# Patient Record
Sex: Male | Born: 1980 | Race: White | Hispanic: No | Marital: Married | Smoking: Current every day smoker
Health system: Southern US, Community
[De-identification: ages and names within clinical notes are randomized; demographics above are authoritative.]

## PROBLEM LIST (undated history)

## (undated) DIAGNOSIS — G43909 Migraine, unspecified, not intractable, without status migrainosus: Secondary | ICD-10-CM

## (undated) DIAGNOSIS — R42 Dizziness and giddiness: Secondary | ICD-10-CM

## (undated) DIAGNOSIS — F419 Anxiety disorder, unspecified: Secondary | ICD-10-CM

## (undated) HISTORY — PX: MOUTH SURGERY: SHX715

---
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
## 2002-03-29 ENCOUNTER — Emergency Department (HOSPITAL_COMMUNITY): Admission: EM | Admit: 2002-03-29 | Discharge: 2002-03-29 | Payer: Self-pay | Admitting: Emergency Medicine

## 2002-04-22 ENCOUNTER — Emergency Department (HOSPITAL_COMMUNITY): Admission: EM | Admit: 2002-04-22 | Discharge: 2002-04-22 | Payer: Self-pay | Admitting: *Deleted

## 2002-05-23 ENCOUNTER — Emergency Department (HOSPITAL_COMMUNITY): Admission: EM | Admit: 2002-05-23 | Discharge: 2002-05-23 | Payer: Self-pay | Admitting: Emergency Medicine

## 2004-07-23 ENCOUNTER — Emergency Department (HOSPITAL_COMMUNITY): Admission: EM | Admit: 2004-07-23 | Discharge: 2004-07-23 | Payer: Self-pay | Admitting: Emergency Medicine

## 2004-08-01 ENCOUNTER — Emergency Department (HOSPITAL_COMMUNITY): Admission: EM | Admit: 2004-08-01 | Discharge: 2004-08-01 | Payer: Self-pay | Admitting: Emergency Medicine

## 2005-01-09 ENCOUNTER — Emergency Department (HOSPITAL_COMMUNITY): Admission: EM | Admit: 2005-01-09 | Discharge: 2005-01-09 | Payer: Self-pay | Admitting: Emergency Medicine

## 2005-01-14 ENCOUNTER — Emergency Department: Payer: Self-pay | Admitting: Emergency Medicine

## 2005-06-06 ENCOUNTER — Emergency Department: Payer: Self-pay | Admitting: Emergency Medicine

## 2005-11-08 ENCOUNTER — Emergency Department: Payer: Self-pay | Admitting: Emergency Medicine

## 2005-11-12 ENCOUNTER — Emergency Department: Payer: Self-pay | Admitting: Unknown Physician Specialty

## 2006-02-03 ENCOUNTER — Emergency Department: Payer: Self-pay | Admitting: Unknown Physician Specialty

## 2006-04-15 ENCOUNTER — Emergency Department: Payer: Self-pay | Admitting: Emergency Medicine

## 2006-04-20 ENCOUNTER — Emergency Department: Payer: Self-pay | Admitting: Emergency Medicine

## 2008-01-16 ENCOUNTER — Emergency Department: Payer: Self-pay | Admitting: Emergency Medicine

## 2008-05-20 ENCOUNTER — Emergency Department: Payer: Self-pay | Admitting: Emergency Medicine

## 2008-08-19 ENCOUNTER — Emergency Department: Payer: Self-pay | Admitting: Emergency Medicine

## 2008-09-01 ENCOUNTER — Emergency Department (HOSPITAL_COMMUNITY): Admission: EM | Admit: 2008-09-01 | Discharge: 2008-09-01 | Payer: Self-pay | Admitting: Emergency Medicine

## 2009-09-04 ENCOUNTER — Emergency Department (HOSPITAL_COMMUNITY): Admission: EM | Admit: 2009-09-04 | Discharge: 2009-09-04 | Payer: Self-pay | Admitting: Emergency Medicine

## 2009-09-27 ENCOUNTER — Emergency Department (HOSPITAL_COMMUNITY): Admission: EM | Admit: 2009-09-27 | Discharge: 2009-09-27 | Payer: Self-pay | Admitting: Emergency Medicine

## 2010-02-20 ENCOUNTER — Emergency Department: Payer: Self-pay | Admitting: Emergency Medicine

## 2010-06-10 ENCOUNTER — Emergency Department: Payer: Self-pay | Admitting: Emergency Medicine

## 2010-12-23 ENCOUNTER — Emergency Department: Payer: Self-pay | Admitting: Emergency Medicine

## 2011-06-05 ENCOUNTER — Emergency Department (HOSPITAL_COMMUNITY)
Admission: EM | Admit: 2011-06-05 | Discharge: 2011-06-06 | Disposition: A | Discharge status: Home / Self Care | Payer: Self-pay | Attending: Emergency Medicine | Admitting: Emergency Medicine

## 2011-06-05 ENCOUNTER — Emergency Department (HOSPITAL_COMMUNITY): Payer: Self-pay

## 2011-06-05 DIAGNOSIS — S0083XA Contusion of other part of head, initial encounter: Secondary | ICD-10-CM | POA: Insufficient documentation

## 2011-06-05 DIAGNOSIS — S0003XA Contusion of scalp, initial encounter: Secondary | ICD-10-CM | POA: Insufficient documentation

## 2011-06-05 DIAGNOSIS — R404 Transient alteration of awareness: Secondary | ICD-10-CM | POA: Insufficient documentation

## 2011-06-05 DIAGNOSIS — S20219A Contusion of unspecified front wall of thorax, initial encounter: Secondary | ICD-10-CM | POA: Insufficient documentation

## 2011-06-05 DIAGNOSIS — S060X1A Concussion with loss of consciousness of 30 minutes or less, initial encounter: Secondary | ICD-10-CM | POA: Insufficient documentation

## 2011-06-05 MED ORDER — IOHEXOL 300 MG/ML  SOLN
100.0000 mL | Freq: Once | INTRAMUSCULAR | Status: AC | PRN
  Administered 2011-06-05: 100 mL via INTRAVENOUS

## 2011-11-22 ENCOUNTER — Emergency Department: Payer: Self-pay | Admitting: Internal Medicine

## 2011-11-22 LAB — COMPREHENSIVE METABOLIC PANEL
Alkaline Phosphatase: 58 U/L (ref 50–136)
BUN: 6 mg/dL — ABNORMAL LOW (ref 7–18)
Bilirubin,Total: 0.9 mg/dL (ref 0.2–1.0)
Calcium, Total: 9.2 mg/dL (ref 8.5–10.1)
Chloride: 102 mmol/L (ref 98–107)
Co2: 24 mmol/L (ref 21–32)
Creatinine: 0.91 mg/dL (ref 0.60–1.30)
EGFR (Non-African Amer.): >60
Osmolality: 271 (ref 275–301)
SGPT (ALT): 30 U/L
Sodium: 137 mmol/L (ref 136–145)
Total Protein: 7.3 g/dL (ref 6.4–8.2)

## 2011-11-22 LAB — CBC
HCT: 41.6 % (ref 40.0–52.0)
MCH: 32.1 pg (ref 26.0–34.0)
MCHC: 33.7 g/dL (ref 32.0–36.0)
Platelet: 179 10*3/uL (ref 150–440)
RDW: 12.8 % (ref 11.5–14.5)
WBC: 16.4 10*3/uL — ABNORMAL HIGH (ref 3.8–10.6)

## 2011-11-24 LAB — BETA STREP CULTURE(ARMC)

## 2011-11-27 LAB — CULTURE, BLOOD (SINGLE)

## 2012-06-07 ENCOUNTER — Encounter (HOSPITAL_COMMUNITY): Payer: Self-pay | Admitting: Emergency Medicine

## 2012-06-07 ENCOUNTER — Emergency Department (HOSPITAL_COMMUNITY): Payer: Self-pay

## 2012-06-07 ENCOUNTER — Emergency Department (HOSPITAL_COMMUNITY)
Admission: EM | Admit: 2012-06-07 | Discharge: 2012-06-07 | Disposition: A | Discharge status: Home / Self Care | Payer: Self-pay | Attending: Emergency Medicine | Admitting: Emergency Medicine

## 2012-06-07 DIAGNOSIS — N50819 Testicular pain, unspecified: Secondary | ICD-10-CM

## 2012-06-07 DIAGNOSIS — N509 Disorder of male genital organs, unspecified: Secondary | ICD-10-CM | POA: Insufficient documentation

## 2012-06-07 DIAGNOSIS — F411 Generalized anxiety disorder: Secondary | ICD-10-CM | POA: Insufficient documentation

## 2012-06-07 DIAGNOSIS — F17200 Nicotine dependence, unspecified, uncomplicated: Secondary | ICD-10-CM | POA: Insufficient documentation

## 2012-06-07 HISTORY — DX: Migraine, unspecified, not intractable, without status migrainosus: G43.909

## 2012-06-07 HISTORY — DX: Anxiety disorder, unspecified: F41.9

## 2012-06-07 LAB — URINALYSIS, ROUTINE W REFLEX MICROSCOPIC
Bilirubin Urine: NEGATIVE
Glucose, UA: NEGATIVE mg/dL
Ketones, ur: NEGATIVE mg/dL
Leukocytes, UA: NEGATIVE
Urobilinogen, UA: 0.2 mg/dL (ref 0.0–1.0)

## 2012-06-07 NOTE — ED Provider Notes (Addendum)
History     CSN: [id number]  Arrival date & time 06/07/12  1409   First MD Initiated Contact with Patient 06/07/12 1505      Chief Complaint  Patient presents with  . Testicle Pain    (Consider location/radiation/quality/duration/timing/severity/associated sxs/prior treatment) Patient is a 31 y.o. male presenting with testicular pain. The history is provided by the patient.  Testicle Pain   patient here complaining of left testicular pain and swelling x1 week. Denies any dysuria or hematuria. No penile drainage or discharge. Pain started at rest and is worse with any kind of scrotal movement. No prior history of same. No history of surgery to that area.  Past Medical History  Diagnosis Date  . Anxiety   . Migraines     No past surgical history on file.  No family history on file.  History  Substance Use Topics  . Smoking status: Current Everyday Smoker  . Smokeless tobacco: Not on file  . Alcohol Use: Yes      Review of Systems  Genitourinary: Positive for testicular pain.  All other systems reviewed and are negative.    Allergies  Penicillins and Sulfa antibiotics  Home Medications   Current Outpatient Rx  Name Route Sig Dispense Refill  . ACETAMINOPHEN 500 MG PO TABS Oral Take 1,000 mg by mouth every 6 (six) hours as needed. For pain.    Marland Kitchen HYDROCODONE-ACETAMINOPHEN 5-500 MG PO TABS Oral Take 1 tablet by mouth once.      BP 105/66  Pulse 81  Temp 98.4 F (36.9 C) (Oral)  Resp 16  Ht 6' (1.829 m)  Wt 140 lb (63.504 kg)  BMI 18.99 kg/m2  SpO2 100%  Physical Exam  Nursing note and vitals reviewed. Constitutional: He is oriented to person, place, and time. Vital signs are normal. He appears well-developed and well-nourished.  Non-toxic appearance. No distress.  HENT:  Head: Normocephalic and atraumatic.  Eyes: Conjunctivae, EOM and lids are normal. Pupils are equal, round, and reactive to light.  Neck: Normal range of motion. Neck supple. No  tracheal deviation present. No mass present.  Cardiovascular: Normal rate, regular rhythm and normal heart sounds.  Exam reveals no gallop.   No murmur heard. Pulmonary/Chest: Effort normal and breath sounds normal. No stridor. No respiratory distress. He has no decreased breath sounds. He has no wheezes. He has no rhonchi. He has no rales.  Abdominal: Soft. Normal appearance and bowel sounds are normal. He exhibits no distension. There is no tenderness. There is no rebound and no CVA tenderness.  Genitourinary:    Left testis shows swelling and tenderness. Left testis shows no mass.  Musculoskeletal: Normal range of motion. He exhibits no edema and no tenderness.  Neurological: He is alert and oriented to person, place, and time. He has normal strength. No cranial nerve deficit or sensory deficit. GCS eye subscore is 4. GCS verbal subscore is 5. GCS motor subscore is 6.  Skin: Skin is warm and dry. No abrasion and no rash noted.  Psychiatric: He has a normal mood and affect. His speech is normal and behavior is normal.    ED Course  Procedures (including critical care time)   Labs Reviewed  URINALYSIS, ROUTINE W REFLEX MICROSCOPIC  URINE CULTURE   No results found.   No diagnosis found.    MDM  Pt given referral to urology and instructed on the importance of followup to exclude cancer        Toy Baker, MD 06/07/12  1706  Toy Baker, MD 06/09/12 [phone]

## 2012-06-07 NOTE — ED Notes (Signed)
Pt returned from ultrasound. No needs at this time.

## 2012-06-07 NOTE — ED Notes (Signed)
Patient transported to Ultrasound 

## 2012-06-07 NOTE — ED Notes (Signed)
Per patient states left testicular pain/swelling for 1 week

## 2012-06-07 NOTE — Progress Notes (Signed)
Late entry for 1705 CM spoke with pt who confirms self pay [facility] resident with no pcp. Discussed the importance of a pcp for f/u. Reviewed [facility] number to assist with finding self pay provider close to pt's residence. Reviewed resources for [facility], general medial clinics, medications-needymeds.com, housing, DSS, health Department and other resources in [employer] including crisis programs Pt voiced understanding and appreciation of resources provided Pt interested in [facility] but aware they are not receiving further patients.  States his mother is active with [facility]

## 2012-06-08 LAB — URINE CULTURE: Culture: NO GROWTH

## 2012-09-29 ENCOUNTER — Emergency Department (HOSPITAL_COMMUNITY)
Admission: EM | Admit: 2012-09-29 | Discharge: 2012-09-29 | Disposition: A | Discharge status: Home / Self Care | Payer: Self-pay | Attending: Emergency Medicine | Admitting: Emergency Medicine

## 2012-09-29 ENCOUNTER — Encounter (HOSPITAL_COMMUNITY): Payer: Self-pay | Admitting: Emergency Medicine

## 2012-09-29 DIAGNOSIS — K0510 Chronic gingivitis, plaque induced: Secondary | ICD-10-CM | POA: Insufficient documentation

## 2012-09-29 DIAGNOSIS — Z8659 Personal history of other mental and behavioral disorders: Secondary | ICD-10-CM | POA: Insufficient documentation

## 2012-09-29 DIAGNOSIS — G43909 Migraine, unspecified, not intractable, without status migrainosus: Secondary | ICD-10-CM | POA: Insufficient documentation

## 2012-09-29 DIAGNOSIS — F17200 Nicotine dependence, unspecified, uncomplicated: Secondary | ICD-10-CM | POA: Insufficient documentation

## 2012-09-29 DIAGNOSIS — K047 Periapical abscess without sinus: Secondary | ICD-10-CM | POA: Insufficient documentation

## 2012-09-29 DIAGNOSIS — R220 Localized swelling, mass and lump, head: Secondary | ICD-10-CM | POA: Insufficient documentation

## 2012-09-29 DIAGNOSIS — H9209 Otalgia, unspecified ear: Secondary | ICD-10-CM | POA: Insufficient documentation

## 2012-09-29 MED ORDER — CLINDAMYCIN HCL 150 MG PO CAPS: 150.0000 mg | ORAL_CAPSULE | Freq: Four times a day (QID) | ORAL | Status: DC

## 2012-09-29 MED ORDER — HYDROMORPHONE HCL PF 1 MG/ML IJ SOLN
1.0000 mg | Freq: Once | INTRAMUSCULAR | Status: AC
  Administered 2012-09-29: 1 mg via INTRAVENOUS
  Filled 2012-09-29: qty 1

## 2012-09-29 MED ORDER — CLINDAMYCIN PHOSPHATE 900 MG/50ML IV SOLN
900.0000 mg | Freq: Once | INTRAVENOUS | Status: AC
  Administered 2012-09-29: 900 mg via INTRAVENOUS
  Filled 2012-09-29: qty 50

## 2012-09-29 MED ORDER — TRAMADOL HCL 50 MG PO TABS: 50.0000 mg | ORAL_TABLET | Freq: Four times a day (QID) | ORAL | Status: DC | PRN

## 2012-09-29 MED ORDER — KETOROLAC TROMETHAMINE 30 MG/ML IJ SOLN
30.0000 mg | Freq: Once | INTRAMUSCULAR | Status: AC
  Administered 2012-09-29: 30 mg via INTRAVENOUS
  Filled 2012-09-29: qty 1

## 2012-09-29 MED ORDER — ONDANSETRON HCL 4 MG/2ML IJ SOLN
4.0000 mg | Freq: Once | INTRAMUSCULAR | Status: AC
  Administered 2012-09-29: 4 mg via INTRAVENOUS
  Filled 2012-09-29: qty 2

## 2012-09-29 MED ORDER — TRAMADOL HCL 50 MG PO TABS
ORAL_TABLET | ORAL | Status: AC
  Filled 2012-09-29: qty 2

## 2012-09-29 MED ORDER — TRAMADOL HCL 50 MG PO TABS
50.0000 mg | ORAL_TABLET | Freq: Once | ORAL | Status: AC
  Administered 2012-09-29: 50 mg via ORAL

## 2012-09-29 NOTE — ED Provider Notes (Signed)
Medical screening examination/treatment/procedure(s) were performed by non-physician practitioner and as supervising physician I was immediately available for consultation/collaboration.  Sunnie Nielsen, MD 09/29/12 [phone]

## 2012-09-29 NOTE — ED Notes (Signed)
Pt states he has pain in his right lower jaw  Pt states he has a broken tooth  Pt has swelling noted to his face on the right side  Pt states the pain is radiating into his neck and up in his face

## 2012-09-29 NOTE — ED Provider Notes (Signed)
History     CSN: [id number]  Arrival date & time 09/29/12  4098   First MD Initiated Contact with Patient 09/29/12 0505      Chief Complaint  Patient presents with  . Dental Pain    (Consider location/radiation/quality/duration/timing/severity/associated sxs/prior treatment) HPI Comments: Patient with a history of pyorrhea presents tonight stating, that one of his decayed teeth broke 2 days ago, and he, has had increased pain, and facial swelling.  He has been taking over-the-counter Tylenol, and ibuprofen, without any relief.  His mother gave him one of her Vicodin, and he received no relief from that either.  He does not have a dentist.  He does not have any medical provider.  Patient is a 31 y.o. male presenting with tooth pain.  Dental PainThe primary symptoms include mouth pain. Primary symptoms do not include dental injury, headaches, fever or shortness of breath. The symptoms began 3 to 5 days ago. The symptoms are worsening. The symptoms occur constantly.  Additional symptoms include: gum swelling, gum tenderness, purulent gums, facial swelling and ear pain. Additional symptoms do not include: trouble swallowing.    Past Medical History  Diagnosis Date  . Anxiety   . Migraines     History reviewed. No pertinent past surgical history.  Family History  Problem Relation Age of Onset  . Cancer Mother   . CAD Other     History  Substance Use Topics  . Smoking status: Current Every Day Smoker    Types: Cigarettes  . Smokeless tobacco: Not on file  . Alcohol Use: Yes      Review of Systems  Constitutional: Negative for fever.  HENT: Positive for ear pain, facial swelling and dental problem. Negative for trouble swallowing and voice change.   Respiratory: Negative for shortness of breath.   Cardiovascular: Negative for chest pain.  Neurological: Negative for dizziness and headaches.    Allergies  Penicillins and Sulfa antibiotics  Home Medications   Current  Outpatient Rx  Name  Route  Sig  Dispense  Refill  . ACETAMINOPHEN 500 MG PO TABS   Oral   Take 1,000 mg by mouth every 6 (six) hours as needed. For pain.         Marland Kitchen CLINDAMYCIN HCL 150 MG PO CAPS   Oral   Take 1 capsule (150 mg total) by mouth every 6 (six) hours.   28 capsule   0   . TRAMADOL HCL 50 MG PO TABS   Oral   Take 1 tablet (50 mg total) by mouth every 6 (six) hours as needed for pain.   15 tablet   0     BP 132/96  Pulse 88  Temp 98.3 F (36.8 C) (Oral)  Resp 22  SpO2 100%  Physical Exam  Constitutional: He appears well-developed and well-nourished.  HENT:  Head: Normocephalic.    Mouth/Throat: Uvula is midline and mucous membranes are normal.         Severe dental decay, and chronic gum swelling.  Right lower first molar broken  Eyes: Pupils are equal, round, and reactive to light.  Neck: Normal range of motion.  Cardiovascular: Normal rate.   Pulmonary/Chest: Effort normal.  Musculoskeletal: Normal range of motion.  Lymphadenopathy:    He has cervical adenopathy.  Neurological: He is alert.  Skin: Skin is warm. No erythema.    ED Course  Procedures (including critical care time)  Labs Reviewed - No data to display No results found.   1. Dental  abscess   2. Chronic simple marginal gingivitis       MDM  Dental abscess         Arman Filter, NP 09/29/12 [phone]

## 2013-06-07 ENCOUNTER — Emergency Department: Payer: Self-pay | Admitting: Emergency Medicine

## 2013-06-08 ENCOUNTER — Emergency Department: Payer: Self-pay | Admitting: Emergency Medicine

## 2013-07-30 ENCOUNTER — Emergency Department: Payer: Self-pay | Admitting: Emergency Medicine

## 2013-08-13 ENCOUNTER — Emergency Department (HOSPITAL_COMMUNITY)
Admission: EM | Admit: 2013-08-13 | Discharge: 2013-08-13 | Disposition: A | Discharge status: Home / Self Care | Payer: Self-pay | Attending: Emergency Medicine | Admitting: Emergency Medicine

## 2013-08-13 ENCOUNTER — Emergency Department (HOSPITAL_COMMUNITY): Payer: Self-pay

## 2013-08-13 ENCOUNTER — Encounter (HOSPITAL_COMMUNITY): Payer: Self-pay | Admitting: *Deleted

## 2013-08-13 ENCOUNTER — Encounter (HOSPITAL_COMMUNITY): Payer: Self-pay | Admitting: Emergency Medicine

## 2013-08-13 DIAGNOSIS — Z8679 Personal history of other diseases of the circulatory system: Secondary | ICD-10-CM | POA: Insufficient documentation

## 2013-08-13 DIAGNOSIS — R42 Dizziness and giddiness: Secondary | ICD-10-CM | POA: Insufficient documentation

## 2013-08-13 DIAGNOSIS — S0993XA Unspecified injury of face, initial encounter: Secondary | ICD-10-CM | POA: Insufficient documentation

## 2013-08-13 DIAGNOSIS — H538 Other visual disturbances: Secondary | ICD-10-CM | POA: Insufficient documentation

## 2013-08-13 DIAGNOSIS — IMO0002 Reserved for concepts with insufficient information to code with codable children: Secondary | ICD-10-CM | POA: Insufficient documentation

## 2013-08-13 DIAGNOSIS — Z8659 Personal history of other mental and behavioral disorders: Secondary | ICD-10-CM | POA: Insufficient documentation

## 2013-08-13 DIAGNOSIS — S298XXA Other specified injuries of thorax, initial encounter: Secondary | ICD-10-CM | POA: Insufficient documentation

## 2013-08-13 DIAGNOSIS — S060X9D Concussion with loss of consciousness of unspecified duration, subsequent encounter: Secondary | ICD-10-CM

## 2013-08-13 DIAGNOSIS — I609 Nontraumatic subarachnoid hemorrhage, unspecified: Secondary | ICD-10-CM

## 2013-08-13 DIAGNOSIS — F17200 Nicotine dependence, unspecified, uncomplicated: Secondary | ICD-10-CM | POA: Insufficient documentation

## 2013-08-13 DIAGNOSIS — R112 Nausea with vomiting, unspecified: Secondary | ICD-10-CM | POA: Insufficient documentation

## 2013-08-13 DIAGNOSIS — R911 Solitary pulmonary nodule: Secondary | ICD-10-CM | POA: Insufficient documentation

## 2013-08-13 DIAGNOSIS — Z880 Allergy status to penicillin: Secondary | ICD-10-CM | POA: Insufficient documentation

## 2013-08-13 DIAGNOSIS — S066X9A Traumatic subarachnoid hemorrhage with loss of consciousness of unspecified duration, initial encounter: Secondary | ICD-10-CM | POA: Insufficient documentation

## 2013-08-13 DIAGNOSIS — R5381 Other malaise: Secondary | ICD-10-CM | POA: Insufficient documentation

## 2013-08-13 DIAGNOSIS — S060X9A Concussion with loss of consciousness of unspecified duration, initial encounter: Secondary | ICD-10-CM | POA: Insufficient documentation

## 2013-08-13 DIAGNOSIS — R209 Unspecified disturbances of skin sensation: Secondary | ICD-10-CM | POA: Insufficient documentation

## 2013-08-13 LAB — CBC WITH DIFFERENTIAL/PLATELET
Basophils Absolute: 0.1 10*3/uL (ref 0.0–0.1)
Eosinophils Relative: 2 % (ref 0–5)
Lymphocytes Relative: 26 % (ref 12–46)
Lymphs Abs: 3.9 10*3/uL (ref 0.7–4.0)
MCV: 91.1 fL (ref 78.0–100.0)
Monocytes Absolute: 1.2 10*3/uL — ABNORMAL HIGH (ref 0.1–1.0)
Neutrophils Relative %: 64 % (ref 43–77)
Platelets: 248 10*3/uL (ref 150–400)
RBC: 4.59 MIL/uL (ref 4.22–5.81)
RDW: 12.9 % (ref 11.5–15.5)
WBC: 14.8 10*3/uL — ABNORMAL HIGH (ref 4.0–10.5)

## 2013-08-13 LAB — BASIC METABOLIC PANEL
CO2: 23 mEq/L (ref 19–32)
Calcium: 8.7 mg/dL (ref 8.4–10.5)
GFR calc non Af Amer: >90 mL/min (ref >90)
Glucose, Bld: 99 mg/dL (ref 70–99)
Potassium: 3.9 mEq/L (ref 3.5–5.1)
Sodium: 143 mEq/L (ref 135–145)

## 2013-08-13 LAB — PROTIME-INR
INR: 0.96 (ref 0.00–1.49)
Prothrombin Time: 12.6 seconds (ref 11.6–15.2)

## 2013-08-13 LAB — ETHANOL: Alcohol, Ethyl (B): 200 mg/dL — ABNORMAL HIGH (ref 0–11)

## 2013-08-13 MED ORDER — HYDROMORPHONE HCL PF 1 MG/ML IJ SOLN
1.0000 mg | Freq: Once | INTRAMUSCULAR | Status: AC
  Administered 2013-08-13: 1 mg via INTRAVENOUS
  Filled 2013-08-13: qty 1

## 2013-08-13 MED ORDER — HYDROCODONE-ACETAMINOPHEN 5-325 MG PO TABS: 1.0000 | ORAL_TABLET | ORAL | Status: DC | PRN

## 2013-08-13 MED ORDER — MORPHINE SULFATE 4 MG/ML IJ SOLN
4.0000 mg | Freq: Once | INTRAMUSCULAR | Status: AC
  Administered 2013-08-13: 4 mg via INTRAVENOUS
  Filled 2013-08-13: qty 1

## 2013-08-13 MED ORDER — OXYCODONE-ACETAMINOPHEN 5-325 MG PO TABS
1.0000 | ORAL_TABLET | Freq: Once | ORAL | Status: AC
  Administered 2013-08-13: 1 via ORAL
  Filled 2013-08-13: qty 1

## 2013-08-13 MED ORDER — SODIUM CHLORIDE 0.9 % IV BOLUS (SEPSIS)
1000.0000 mL | Freq: Once | INTRAVENOUS | Status: AC
  Administered 2013-08-13: 1000 mL via INTRAVENOUS

## 2013-08-13 MED ORDER — METOCLOPRAMIDE HCL 5 MG/ML IJ SOLN
10.0000 mg | Freq: Once | INTRAMUSCULAR | Status: AC
  Administered 2013-08-13: 10 mg via INTRAVENOUS
  Filled 2013-08-13: qty 2

## 2013-08-13 MED ORDER — DIPHENHYDRAMINE HCL 50 MG/ML IJ SOLN
25.0000 mg | Freq: Once | INTRAMUSCULAR | Status: AC
  Administered 2013-08-13: 25 mg via INTRAVENOUS
  Filled 2013-08-13: qty 1
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
## 2013-08-13 NOTE — ED Notes (Signed)
Pt provided paper scrub top and ginger ale. Is going to wait in waiting room for his ride.

## 2013-08-13 NOTE — ED Notes (Signed)
Pt ambulatory to discharge. Is a x 4 and is waiting for his ride.

## 2013-08-13 NOTE — ED Notes (Signed)
Pt observed crossing and uncrossing legs on own without difficulty.
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
## 2013-08-13 NOTE — ED Notes (Signed)
Verified with both PA and EDP that patient IS supposed to be discharged. They report they have spoken to neurosurgery and that he is to be discharged.

## 2013-08-13 NOTE — ED Notes (Signed)
PA in to speak with patient about returning if h/a worsens or any new s/s develop. Pt verbalized understanding. Pt given rx for vicodin and reports he has rx for phenergan at home. Pt is calling him mom to come get him.
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
## 2013-08-13 NOTE — ED Notes (Signed)
Pt able to squeeze fingers to both hands. States he cannot lift either legs.
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
## 2013-08-14 NOTE — ED Provider Notes (Signed)
I saw and evaluated the patient, reviewed the resident's note and I agree with the findings and plan.   Neuro exam normal.  VSS.  No evidence of worsening SDH or new finding on CT.  Suspect pt has concussive pain and also dehydration.  Symptoms improved with treatment.  ER precautions given.    Darlys Gales, MD 08/14/13 [phone]
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
## 2014-05-08 ENCOUNTER — Emergency Department: Payer: Self-pay | Admitting: Emergency Medicine

## 2014-05-16 ENCOUNTER — Emergency Department: Payer: Self-pay | Admitting: Emergency Medicine

## 2014-08-10 ENCOUNTER — Emergency Department: Payer: Self-pay | Admitting: Emergency Medicine

## 2014-08-10 LAB — RAPID INFLUENZA A&B ANTIGENS (ARMC ONLY)

## 2015-01-11 ENCOUNTER — Encounter (HOSPITAL_COMMUNITY): Payer: Self-pay | Admitting: Emergency Medicine

## 2015-01-11 ENCOUNTER — Emergency Department (HOSPITAL_COMMUNITY)
Admission: EM | Admit: 2015-01-11 | Discharge: 2015-01-11 | Disposition: A | Discharge status: Home / Self Care | Payer: BLUE CROSS/BLUE SHIELD | Attending: Emergency Medicine | Admitting: Emergency Medicine

## 2015-01-11 ENCOUNTER — Emergency Department: Payer: Self-pay | Admitting: Emergency Medicine

## 2015-01-11 DIAGNOSIS — Z8669 Personal history of other diseases of the nervous system and sense organs: Secondary | ICD-10-CM | POA: Diagnosis not present

## 2015-01-11 DIAGNOSIS — Z880 Allergy status to penicillin: Secondary | ICD-10-CM | POA: Diagnosis not present

## 2015-01-11 DIAGNOSIS — K029 Dental caries, unspecified: Secondary | ICD-10-CM | POA: Insufficient documentation

## 2015-01-11 DIAGNOSIS — K088 Other specified disorders of teeth and supporting structures: Secondary | ICD-10-CM | POA: Insufficient documentation

## 2015-01-11 DIAGNOSIS — Z720 Tobacco use: Secondary | ICD-10-CM | POA: Diagnosis not present

## 2015-01-11 DIAGNOSIS — K0889 Other specified disorders of teeth and supporting structures: Secondary | ICD-10-CM

## 2015-01-11 DIAGNOSIS — K0381 Cracked tooth: Secondary | ICD-10-CM | POA: Insufficient documentation

## 2015-01-11 DIAGNOSIS — Z8659 Personal history of other mental and behavioral disorders: Secondary | ICD-10-CM | POA: Insufficient documentation

## 2015-01-11 MED ORDER — CLINDAMYCIN HCL 150 MG PO CAPS: 150.0000 mg | ORAL_CAPSULE | Freq: Four times a day (QID) | ORAL | Status: DC

## 2015-01-11 MED ORDER — HYDROCODONE-ACETAMINOPHEN 5-325 MG PO TABS: 2.0000 | ORAL_TABLET | ORAL | Status: DC | PRN
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
## 2015-01-11 NOTE — Discharge Instructions (Signed)

## 2015-01-11 NOTE — ED Notes (Signed)
Patient states dental pain x 2 days to R lower teeth.   Patient has decay in teeth.   Patient states swelling to area.   Patient states has not been to dentist.   Patient states took tylenol and ibuprofen yesterday but hasn't taking anything today.

## 2015-01-31 ENCOUNTER — Emergency Department: Payer: Self-pay | Admitting: Emergency Medicine

## 2015-05-24 ENCOUNTER — Emergency Department
Admission: EM | Admit: 2015-05-24 | Discharge: 2015-05-24 | Disposition: A | Discharge status: Home / Self Care | Payer: BLUE CROSS/BLUE SHIELD | Attending: Emergency Medicine | Admitting: Emergency Medicine

## 2015-05-24 ENCOUNTER — Emergency Department: Payer: BLUE CROSS/BLUE SHIELD

## 2015-05-24 DIAGNOSIS — Y9301 Activity, walking, marching and hiking: Secondary | ICD-10-CM | POA: Insufficient documentation

## 2015-05-24 DIAGNOSIS — Z720 Tobacco use: Secondary | ICD-10-CM | POA: Diagnosis not present

## 2015-05-24 DIAGNOSIS — Z880 Allergy status to penicillin: Secondary | ICD-10-CM | POA: Insufficient documentation

## 2015-05-24 DIAGNOSIS — S8262XA Displaced fracture of lateral malleolus of left fibula, initial encounter for closed fracture: Secondary | ICD-10-CM | POA: Diagnosis not present

## 2015-05-24 DIAGNOSIS — Y92009 Unspecified place in unspecified non-institutional (private) residence as the place of occurrence of the external cause: Secondary | ICD-10-CM | POA: Insufficient documentation

## 2015-05-24 DIAGNOSIS — X58XXXA Exposure to other specified factors, initial encounter: Secondary | ICD-10-CM | POA: Diagnosis not present

## 2015-05-24 DIAGNOSIS — Y998 Other external cause status: Secondary | ICD-10-CM | POA: Insufficient documentation

## 2015-05-24 DIAGNOSIS — S99912A Unspecified injury of left ankle, initial encounter: Secondary | ICD-10-CM | POA: Diagnosis present

## 2015-05-24 MED ORDER — IBUPROFEN 800 MG PO TABS
800.0000 mg | ORAL_TABLET | Freq: Once | ORAL | Status: AC
  Administered 2015-05-24: 800 mg via ORAL

## 2015-05-24 MED ORDER — IBUPROFEN 600 MG PO TABS: 600.0000 mg | ORAL_TABLET | Freq: Three times a day (TID) | ORAL | Status: DC | PRN

## 2015-05-24 MED ORDER — OXYCODONE-ACETAMINOPHEN 5-325 MG PO TABS: 1.0000 | ORAL_TABLET | Freq: Three times a day (TID) | ORAL | Status: DC | PRN

## 2015-05-24 NOTE — ED Notes (Signed)
Stepped on his daughter's toy and twisted left ankle one week ago. States he "is hurting so freaking bad, needs something for pain." Left ankle swollen, patient is non-weightbearing. Patient very histrionic in triage about pain. States he took an "Oxy that was prescribed to him" prior to arrival.

## 2015-05-24 NOTE — Discharge Instructions (Signed)
Take medication as prescribed. Apply ice and elevate. Keep ankle and splint and use crutches. Follow-up with orthopedic next week. See above to call tomorrow to schedule follow-up appointment.  Return to the ER for new or worsening concerns.  Ankle Fracture A fracture is a break in a bone. The ankle joint is made up of three bones. These include the lower (distal)sections of your lower leg bones, called the tibia and fibula, along with a bone in your foot, called the talus. Depending on how bad the break is and if more than one ankle joint bone is broken, a cast or splint is used to protect and keep your injured bone from moving while it heals. Sometimes, surgery is required to help the fracture heal properly.  There are two general types of fractures:  Stable fracture. This includes a single fracture line through one bone, with no injury to ankle ligaments. A fracture of the talus that does not have any displacement (movement of the bone on either side of the fracture line) is also stable.  Unstable fracture. This includes more than one fracture line through one or more bones in the ankle joint. It also includes fractures that have displacement of the bone on either side of the fracture line. CAUSES  A direct blow to the ankle.   Quickly and severely twisting your ankle.  Trauma, such as a car accident or falling from a significant height. RISK FACTORS You may be at a higher risk of ankle fracture if:  You have certain medical conditions.  You are involved in high-impact sports.  You are involved in a high-impact car accident. SIGNS AND SYMPTOMS   Tender and swollen ankle.  Bruising around the injured ankle.  Pain on movement of the ankle.  Difficulty walking or putting weight on the ankle.  A cold foot below the site of the ankle injury. This can occur if the blood vessels passing through your injured ankle were also damaged.  Numbness in the foot below the site of the ankle  injury. DIAGNOSIS  An ankle fracture is usually diagnosed with a physical exam and X-rays. A CT scan may also be required for complex fractures. TREATMENT  Stable fractures are treated with a cast or splint and using crutches to avoid putting weight on your injured ankle. This is followed by an ankle strengthening program. Some patients require a special type of cast, depending on other medical problems they may have. Unstable fractures require surgery to ensure the bones heal properly. Your health care provider will tell you what type of fracture you have and the best treatment for your condition. HOME CARE INSTRUCTIONS   Review correct crutch use with your health care provider and use your crutches as directed. Safe use of crutches is extremely important. Misuse of crutches can cause you to fall or cause injury to nerves in your hands or armpits.  Do not put weight or pressure on the injured ankle until directed by your health care provider.  To lessen the swelling, keep the injured leg elevated while sitting or lying down.  Apply ice to the injured area:  Put ice in a plastic bag.  Place a towel between your cast and the bag.  Leave the ice on for 20 minutes, 2-3 times a day.  If you have a plaster or fiberglass cast:  Do not try to scratch the skin under the cast with any objects. This can increase your risk of skin infection.  Check the skin around  the cast every day. You may put lotion on any red or sore areas.  Keep your cast dry and clean.  If you have a plaster splint:  Wear the splint as directed.  You may loosen the elastic around the splint if your toes become numb, tingle, or turn cold or blue.  Do not put pressure on any part of your cast or splint; it may break. Rest your cast only on a pillow the first 24 hours until it is fully hardened.  Your cast or splint can be protected during bathing with a plastic bag sealed to your skin with medical tape. Do not lower the  cast or splint into water.  Take medicines as directed by your health care provider. Only take over-the-counter or prescription medicines for pain, discomfort, or fever as directed by your health care provider.  Do not drive a vehicle until your health care provider specifically tells you it is safe to do so.  If your health care provider has given you a follow-up appointment, it is very important to keep that appointment. Not keeping the appointment could result in a chronic or permanent injury, pain, and disability. If you have any problem keeping the appointment, call the facility for assistance. SEEK MEDICAL CARE IF: You develop increased swelling or discomfort. SEEK IMMEDIATE MEDICAL CARE IF:   Your cast gets damaged or breaks.  You have continued severe pain.  You develop new pain or swelling after the cast was put on.  Your skin or toenails below the injury turn blue or gray.  Your skin or toenails below the injury feel cold, numb, or have loss of sensitivity to touch.  There is a bad smell or pus draining from under the cast. MAKE SURE YOU:   Understand these instructions.  Will watch your condition.  Will get help right away if you are not doing well or get worse. Document Released: 10/24/2000 Document Revised: 11/01/2013 Document Reviewed: 05/26/2013 [facility] Patient Information 2015 [employer], [place]. This information is not intended to replace advice given to you by your health care provider. Make sure you discuss any questions you have with your health care provider.
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
## 2015-05-24 NOTE — ED Notes (Signed)
Pt sound asleep and took shaking to awaken. Took pain meds prior to arrival

## 2015-05-24 NOTE — ED Provider Notes (Signed)
[facility] Emergency Department Provider Note  ____________________________________________  Time seen: Approximately 10:59 PM  I have reviewed the triage vital signs and the nursing notes.   HISTORY  Chief Complaint Ankle Pain   HPI Lee Ruiz is a 34 y.o. male who presents the ER for complaints of left lateral ankle pain. Patient states that just several weeks ago he was at home and walking and axial extensors on the daughter's toy twisting left ankle. Denies other pain or other injury.. Patient reports that he has had left ankle pain since. Patient reports that he has continued to work but with pain to his left ankle. Patient states last few days he is now having more pain with walking. Denies head injury or loss consciousness. Patient denies other pain or other injury.  Patient states that pain is currently 10/10 to his left lateral ankle. Denies pain radiation. Denies pain above left ankle denies pain below left ankle. Denies other pain. Patient states that prior to arrival he took a leftover oxycodone pain pills which does not take his pain away per patient.    Past Medical History  Diagnosis Date  . Anxiety   . Migraines     There are no active problems to display for this patient.   No past surgical history on file.  Current Outpatient Rx  Name  Route  Sig  Dispense  Refill                          Allergies Penicillins; Sulfa antibiotics; and Tramadol  Family History  Problem Relation Age of Onset  . Cancer Mother   . CAD Other     Social History History  Substance Use Topics  . Smoking status: Current Every Day Smoker -- 1.00 packs/day    Types: Cigarettes  . Smokeless tobacco: Not on file  . Alcohol Use: Yes     Comment: qd   patient denies drug or recent alcohol use.  Review of Systems Constitutional: No fever/chills Eyes: No visual changes. ENT: No sore throat. Cardiovascular: Denies chest pain. Respiratory:  Denies shortness of breath. Gastrointestinal: No abdominal pain.  No nausea, no vomiting.  No diarrhea.  No constipation. Genitourinary: Negative for dysuria. Musculoskeletal: Negative for back pain. Positive  for left ankle pain. Skin: Negative for rash. Neurological: Negative for headaches, focal weakness or numbness.  10-point ROS otherwise negative.  ____________________________________________   PHYSICAL EXAM:  VITAL SIGNS: ED Triage Vitals  Enc Vitals Group     BP 05/24/15 2121 113/73 mmHg     Pulse Rate 05/24/15 2121 94     Resp -- 18     Temp 05/24/15 2121 98.2 F (36.8 C)     Temp Source 05/24/15 2121 Oral     SpO2 05/24/15 2121 95 %     Weight 05/24/15 2121 145 lb (65.772 kg)     Height 05/24/15 2121 6' (1.829 m)     Head Cir --      Peak Flow --      Pain Score 05/24/15 2203 6     Pain Loc --      Pain Edu? --      Excl. in GC? --     Constitutional: Patient sleeping soundly in room and snoring upon entering. After multiple times calling his name patient awoke. Patient Alert and oriented. Well appearing and in no acute distress. Eyes: Conjunctivae are normal. PERRL. EOMI. Head: Atraumatic. Nose: No congestion/rhinnorhea.  Mouth/Throat: Mucous membranes are moist.  Neck: No stridor.  No cervical spine tenderness to palpation. Hematological/Lymphatic/Immunilogical: No cervical lymphadenopathy. Cardiovascular: Normal rate, regular rhythm. Grossly normal heart sounds.  Good peripheral circulation. Respiratory: Normal respiratory effort.  No retractions. Lungs CTAB. Gastrointestinal: Soft and nontender. No distention.  Musculoskeletal: No lower or upper extremity tenderness nor edema.  No joint effusions. No cervical, thoracic or lumbar tenderness to palpation. Except: left lateral ankle mild swelling and ecchymosis, mod TTP, pain with rotation. NO pain in left foot or above left ankle. Bilateral distal pedal pulses equal and easily palpated. Neurologic:  Normal  speech and language. No gross focal neurologic deficits are appreciated. No gait instability. Skin:  Skin is warm, dry and intact. No rash noted. Psychiatric: Mood and affect are normal. Speech and behavior are normal.  ____________________________________________   LABS (all labs ordered are listed, but only abnormal results are displayed)  Labs Reviewed - No data to display  RADIOLOGY LEFT ANKLE COMPLETE - 3+ VIEW  COMPARISON: None.  FINDINGS: Mild lateral soft tissue swelling is seen. A tiny ossific density is seen along the inferior aspect of the lateral malleolus, likely representing a tiny avulsion fracture fragment. No other fractures are identified. Alignment of bones is normal.  IMPRESSION: Mild lateral soft tissue swelling with probable tiny avulsion fracture fragment along the inferior aspect of the lateral malleolus.   Electronically Signed By: Myles RosenthalJohn Stahl M.D. On: 05/24/2015 22:08      I, Renford DillsLindsey Nahom Carfagno, personally viewed and evaluated these images as part of my medical decision making.    ____________________________________________   PROCEDURES  Procedure(s) performed:  SPLINT APPLICATION Date/Time: 11:34 PM Authorized by: Renford DillsLindsey Hesston Hitchens Consent: Verbal consent obtained. Risks and benefits: risks, benefits and alternatives were discussed Consent given by: patient Splint applied by:ed technician Location details: left ankle Splint type: stirrup ocl Supplies used:ocl and crutches Post-procedure: The splinted body part was neurovascularly unchanged following the procedure. Patient tolerance: Patient tolerated the procedure well with no immediate complications.    ____________________________________________   INITIAL IMPRESSION / ASSESSMENT AND PLAN / ED COURSE  Pertinent labs & imaging results that were available during my care of the patient were reviewed by me and considered in my medical decision making (see chart for  details).  Very well-appearing patient. No acute distress. Patient sleeping in room soundly upon entering in the room. Patient awoke with verbal stimulation. As soon as patient woke he complained of 10 out of 10 pain. Patient reports taking home oxycodone prior to arrival. Patient states the oxycodone was left over from a prior prescription. [place] controlled substance database utilized and no recent controlled substances prescribed and documented in the database since March 2016 at that time he received quantity of 10 of oxycodone 5 mg tablets.  Discussed with patient to apply ice and elevate. We'll prescribe quantity of 9 Percocet and ibuprofen. Splint and crutches. Discussed follow-up and return parameters. Follow-up with orthopedic next week. Patient verbalized understanding and agreed to plan. ____________________________________________   FINAL CLINICAL IMPRESSION(S) / ED DIAGNOSES  Final diagnoses:  Lateral malleolar fracture, left, closed, initial encounter  Left ankle pain     Renford DillsLindsey Koby Pickup, NP 05/24/15 [phone]  Maurilio LovelyNoelle McLaurin, MD 06/22/15 2340

## 2015-11-03 ENCOUNTER — Emergency Department
Admission: EM | Admit: 2015-11-03 | Discharge: 2015-11-03 | Disposition: A | Discharge status: Home / Self Care | Payer: BLUE CROSS/BLUE SHIELD | Attending: Emergency Medicine | Admitting: Emergency Medicine

## 2015-11-03 DIAGNOSIS — Z880 Allergy status to penicillin: Secondary | ICD-10-CM | POA: Insufficient documentation

## 2015-11-03 DIAGNOSIS — K0381 Cracked tooth: Secondary | ICD-10-CM | POA: Diagnosis not present

## 2015-11-03 DIAGNOSIS — F17210 Nicotine dependence, cigarettes, uncomplicated: Secondary | ICD-10-CM | POA: Diagnosis not present

## 2015-11-03 DIAGNOSIS — K0889 Other specified disorders of teeth and supporting structures: Secondary | ICD-10-CM | POA: Diagnosis not present

## 2015-11-03 MED ORDER — CLINDAMYCIN HCL 300 MG PO CAPS: 300.0000 mg | ORAL_CAPSULE | Freq: Three times a day (TID) | ORAL | Status: DC

## 2015-11-03 MED ORDER — IBUPROFEN 800 MG PO TABS: 800.0000 mg | ORAL_TABLET | Freq: Three times a day (TID) | ORAL | Status: DC | PRN

## 2015-11-03 MED ORDER — OXYCODONE-ACETAMINOPHEN 5-325 MG PO TABS: 1.0000 | ORAL_TABLET | Freq: Four times a day (QID) | ORAL | Status: AC | PRN

## 2015-11-03 MED ORDER — IBUPROFEN 400 MG PO TABS
ORAL_TABLET | ORAL | Status: AC
  Filled 2015-11-03: qty 1

## 2015-11-03 MED ORDER — IBUPROFEN 400 MG PO TABS
400.0000 mg | ORAL_TABLET | Freq: Once | ORAL | Status: AC
  Administered 2015-11-03: 400 mg via ORAL

## 2015-11-03 NOTE — ED Notes (Signed)
Pt states left lower molar pain that began "days ago, but it's gotten worse and i can't take it anymore." pt states took 1500mg  tylenol and oxycodone 10mg  pta.

## 2015-11-03 NOTE — ED Provider Notes (Signed)
[facility] Emergency Department Provider Note  ____________________________________________  Time seen: Approximately 6:40 AM  I have reviewed the triage vital signs and the nursing notes.   HISTORY  Chief Complaint Dental Pain    HPI Lee Ruiz is a 34 y.o. male who reports long history of toothache. However it got very much worse the last day or so and he can barely stand it. [facility] this coming month. Pain is in the left lower jaw.  Past Medical History  Diagnosis Date  . Anxiety   . Migraines     There are no active problems to display for this patient.   No past surgical history on file.  Current Outpatient Rx  Name  Route  Sig  Dispense  Refill  . clindamycin (CLEOCIN) 150 MG capsule   Oral   Take 1 capsule (150 mg total) by mouth every 6 (six) hours.   28 capsule   0   . clindamycin (CLEOCIN) 300 MG capsule   Oral   Take 1 capsule (300 mg total) by mouth 3 (three) times daily.   30 capsule   0   . HYDROcodone-acetaminophen (NORCO/VICODIN) 5-325 MG per tablet   Oral   Take 2 tablets by mouth every 4 (four) hours as needed.   10 tablet   0   . ibuprofen (ADVIL,MOTRIN) 600 MG tablet   Oral   Take 1 tablet (600 mg total) by mouth every 8 (eight) hours as needed for mild pain or moderate pain.   15 tablet   0   . ibuprofen (ADVIL,MOTRIN) 800 MG tablet   Oral   Take 1 tablet (800 mg total) by mouth every 8 (eight) hours as needed.   30 tablet   0   . oxyCODONE-acetaminophen (ROXICET) 5-325 MG per tablet   Oral   Take 1 tablet by mouth every 8 (eight) hours as needed for moderate pain or severe pain (Do not drive or operate heavy machinery while taking as can cause drowsiness.).   9 tablet   0   . oxyCODONE-acetaminophen (ROXICET) 5-325 MG tablet   Oral   Take 1 tablet by mouth every 6 (six) hours as needed.   20 tablet   0     Allergies Penicillins; Sulfa antibiotics; Tramadol;  and Vicodin  Family History  Problem Relation Age of Onset  . Cancer Mother   . CAD Other     Social History Social History  Substance Use Topics  . Smoking status: Current Every Day Smoker -- 1.00 packs/day    Types: Cigarettes  . Smokeless tobacco: Not on file  . Alcohol Use: Yes     Comment: qd    Review of Systems Constitutional: No fever/chills Eyes: No visual changes. ENT: No sore throat. Cardiovascular: Denies chest pain. Respiratory: Denies shortness of breath. Gastrointestinal: No abdominal pain.  No nausea, no vomiting.  No diarrhea.  No constipation. Genitourinary: Negative for dysuria. Musculoskeletal: Negative for back pain. Skin: Negative for rash. Neurological: Negative for headaches, focal weakness or numbness.  10-point ROS otherwise negative.  ____________________________________________   PHYSICAL EXAM:  VITAL SIGNS: ED Triage Vitals  Enc Vitals Group     BP 11/03/15 0451 133/91 mmHg     Pulse Rate 11/03/15 0451 90     Resp 11/03/15 0451 16     Temp 11/03/15 0451 98.3 F (36.8 C)     Temp Source 11/03/15 0451 Oral     SpO2 11/03/15  0451 100 %     Weight 11/03/15 0451 140 lb (63.504 kg)     Height 11/03/15 0451 6' (1.829 m)     Head Cir --      Peak Flow --      Pain Score 11/03/15 0452 9     Pain Loc --      Pain Edu? --      Excl. in GC? --    Constitutional: Alert and oriented. Well appearing and in no acute distress. Eyes: Conjunctivae are normal. PERRL. EOMI. Head: Atraumatic. Nose: No congestion/rhinnorhea. Mouth/Throat: Mucous membranes are moist.  Oropharynx non-erythematous. Numerous broken teeth and left lower jaw one has some gum around it that looks swollen and redder. There is a tender area below the jaw on the left side slight swelling in the local area Neck: No stridor.  Cardiovascular: Normal rate, regular rhythm. Grossly normal heart sounds.  Good peripheral circulation. Respiratory: Normal respiratory effort.  No  retractions. Lungs CTAB. Gastrointestinal: Soft and nontender. No distention. No abdominal bruits. No CVA tenderness. Musculoskeletal: No lower extremity tenderness nor edema.  No joint effusions. Neurologic:  Normal speech and language. No gross focal neurologic deficits are appreciated. No gait instability. Skin:  Skin is warm, dry and intact. No rash noted. Psychiatric: Mood and affect are normal. Speech and behavior are normal.  ____________________________________________   LABS (all labs ordered are listed, but only abnormal results are displayed)  Labs Reviewed - No data to display ____________________________________________  EKG  ____________________________________________  RADIOLOGY  ____________________________________________   PROCEDURES    ____________________________________________   INITIAL IMPRESSION / ASSESSMENT AND PLAN / ED COURSE  Pertinent labs & imaging results that were available during my care of the patient were reviewed by me and considered in my medical decision making (see chart for details).  ____________________________________________   FINAL CLINICAL IMPRESSION(S) / ED DIAGNOSES  Final diagnoses:  Toothache      Arnaldo Natal, MD 11/03/15 [phone]

## 2015-11-05 ENCOUNTER — Emergency Department
Admission: EM | Admit: 2015-11-05 | Discharge: 2015-11-05 | Disposition: A | Discharge status: Home / Self Care | Payer: BLUE CROSS/BLUE SHIELD | Attending: Emergency Medicine | Admitting: Emergency Medicine

## 2015-11-05 ENCOUNTER — Encounter: Payer: Self-pay | Admitting: Emergency Medicine

## 2015-11-05 DIAGNOSIS — F17210 Nicotine dependence, cigarettes, uncomplicated: Secondary | ICD-10-CM | POA: Diagnosis not present

## 2015-11-05 DIAGNOSIS — K0889 Other specified disorders of teeth and supporting structures: Secondary | ICD-10-CM | POA: Insufficient documentation

## 2015-11-05 DIAGNOSIS — K029 Dental caries, unspecified: Secondary | ICD-10-CM | POA: Insufficient documentation

## 2015-11-05 DIAGNOSIS — Z880 Allergy status to penicillin: Secondary | ICD-10-CM | POA: Diagnosis not present

## 2015-11-05 DIAGNOSIS — Z792 Long term (current) use of antibiotics: Secondary | ICD-10-CM | POA: Insufficient documentation

## 2015-11-05 NOTE — ED Provider Notes (Signed)
[facility] Emergency Department Provider Note  ____________________________________________  Time seen: Approximately 7:21 AM  I have reviewed the triage vital signs and the nursing notes.   HISTORY  Chief Complaint Dental Pain    HPI Lee Ruiz is a 34 y.o. male complaining of continued dental pain. He stated 2 days ago placed on antibiotics and pain medication but states swelling has really not improved. Patient wears no dental clinic open today. Patient requests work note so [facility]. Patient rates his pain as a 7/10 describe the pain as sharp. She denies any fever associated this complaint. Patient denies any dysphagia.   Past Medical History  Diagnosis Date  . Anxiety   . Migraines     There are no active problems to display for this patient.   History reviewed. No pertinent past surgical history.  Current Outpatient Rx  Name  Route  Sig  Dispense  Refill  . clindamycin (CLEOCIN) 150 MG capsule   Oral   Take 1 capsule (150 mg total) by mouth every 6 (six) hours.   28 capsule   0   . clindamycin (CLEOCIN) 300 MG capsule   Oral   Take 1 capsule (300 mg total) by mouth 3 (three) times daily.   30 capsule   0   . HYDROcodone-acetaminophen (NORCO/VICODIN) 5-325 MG per tablet   Oral   Take 2 tablets by mouth every 4 (four) hours as needed.   10 tablet   0   . ibuprofen (ADVIL,MOTRIN) 600 MG tablet   Oral   Take 1 tablet (600 mg total) by mouth every 8 (eight) hours as needed for mild pain or moderate pain.   15 tablet   0   . ibuprofen (ADVIL,MOTRIN) 800 MG tablet   Oral   Take 1 tablet (800 mg total) by mouth every 8 (eight) hours as needed.   30 tablet   0   . oxyCODONE-acetaminophen (ROXICET) 5-325 MG per tablet   Oral   Take 1 tablet by mouth every 8 (eight) hours as needed for moderate pain or severe pain (Do not drive or operate heavy machinery while taking as can cause  drowsiness.).   9 tablet   0   . oxyCODONE-acetaminophen (ROXICET) 5-325 MG tablet   Oral   Take 1 tablet by mouth every 6 (six) hours as needed.   20 tablet   0     Allergies Penicillins; Sulfa antibiotics; Tramadol; and Vicodin  Family History  Problem Relation Age of Onset  . Cancer Mother   . CAD Other     Social History Social History  Substance Use Topics  . Smoking status: Current Every Day Smoker -- 1.00 packs/day    Types: Cigarettes  . Smokeless tobacco: None  . Alcohol Use: Yes     Comment: qd    Review of Systems Constitutional: No fever/chills Eyes: No visual changes. ENT: No sore throat. Dental pain Cardiovascular: Denies chest pain. Respiratory: Denies shortness of breath. Gastrointestinal: No abdominal pain.  No nausea, no vomiting.  No diarrhea.  No constipation. Genitourinary: Negative for dysuria. Musculoskeletal: Negative for back pain. Skin: Negative for rash. Neurological: Negative for headaches, focal weakness or numbness.  10-point ROS otherwise negative.  ____________________________________________   PHYSICAL EXAM:  VITAL SIGNS: ED Triage Vitals  Enc Vitals Group     BP 11/05/15 0538 118/83 mmHg     Pulse Rate 11/05/15 0538 86     Resp  11/05/15 0538 18     Temp 11/05/15 0538 98.2 F (36.8 C)     Temp Source 11/05/15 0538 Oral     SpO2 11/05/15 0538 98 %     Weight 11/05/15 0538 140 lb (63.504 kg)     Height 11/05/15 0538 6' (1.829 m)     Head Cir --      Peak Flow --      Pain Score 11/05/15 0538 7     Pain Loc --      Pain Edu? --      Excl. in GC? --     Constitutional: Alert and oriented. Well appearing and in no acute distress. Eyes: Conjunctivae are normal. PERRL. EOMI. Head: Atraumatic. Nose: No congestion/rhinnorhea. Mouth/Throat: Mucous membranes are moist.  Oropharynx non-erythematous. Edematous gingiva Neck: No stridor. No cervical spine tenderness to palpation. Hematological/Lymphatic/Immunilogical: No  cervical lymphadenopathy. Cardiovascular: Normal rate, regular rhythm. Grossly normal heart sounds.  Good peripheral circulation. Respiratory: Normal respiratory effort.  No retractions. Lungs CTAB. Gastrointestinal: Soft and nontender. No distention. No abdominal bruits. No CVA tenderness. Musculoskeletal: No lower extremity tenderness nor edema.  No joint effusions. Neurologic:  Normal speech and language. No gross focal neurologic deficits are appreciated. No gait instability. Skin:  Skin is warm, dry and intact. No rash noted. Psychiatric: Mood and affect are normal. Speech and behavior are normal.  ____________________________________________   LABS (all labs ordered are listed, but only abnormal results are displayed)  Labs Reviewed - No data to display ____________________________________________  EKG   ____________________________________________  RADIOLOGY   ____________________________________________   PROCEDURES  Procedure(s) performed: None  Critical Care performed: No  ____________________________________________   INITIAL IMPRESSION / ASSESSMENT AND PLAN / ED COURSE  Pertinent labs & imaging results that were available during my care of the patient were reviewed by me and considered in my medical decision making (see chart for details).  Dental pain. Asked patient to continue antibiotics and pain medication prescribed 2 days ago. Patient advised to follow the walk-in dental clinic tomorrow morning. Patient given a work note for 2 days. ____________________________________________   FINAL CLINICAL IMPRESSION(S) / ED DIAGNOSES  Final diagnoses:  Pain due to dental caries      Joni Reining, PA-C 11/05/15 1610  Jene Every, MD 11/05/15 [phone]

## 2015-11-05 NOTE — Discharge Instructions (Signed)
Past patient follow both [facility] dental clinic [facility] tomorrow morning.  OPTIONS FOR DENTAL FOLLOW UP CARE  [place] Department of Health and Human Services - Local Safety Net Dental Clinics [email or web address].gov/dph/[email or web address]/services/[email or web address].htm   [facility] [phone])  [facility] [place] [phone])  [facility] [phone]  [facility] [phone]  [facility] [phone]) This clinic caters to the indigent population and is on a lottery system. Location: [employer] [address] [facility], [address], [place] [facility] Hours: Wednesdays from 6pm - 9pm, patients seen by a lottery system. For dates, call or go to [email or web address] Services: Cleanings, fillings and simple extractions. Payment Options: DENTAL WORK IS FREE OF CHARGE. Bring proof of income or support. Best way to get seen: Arrive at 5:15 pm - this is a lottery, NOT first come/first serve, so arriving earlier will not increase your chances of being seen.     [facility] [phone] Select option 1 for emergencies   Location: [facility], [place], [address], [place] [facility] Hours: No walk-ins accepted - call the day before to schedule an appointment. Check in times are 9:30 am and 1:30 pm. Services: Simple extractions, temporary fillings, pulpectomy/pulp debridement, uncomplicated abscess drainage. Payment Options: PAYMENT IS DUE AT THE TIME OF SERVICE.  Fee is usually $100-200, additional surgical procedures (e.g. abscess drainage) may be extra. Cash, checks, Visa/MasterCard accepted.  Can file Medicaid if patient is covered for dental - patient should call case worker to check. No discount for [facility] patients. Best way to get seen: MUST call the day before and get onto the schedule. Can usually be seen the next 1-2  days. No walk-ins accepted.     [facility] [phone]   Location: [facility], [address], [place] Clinic Hours: M, W, Th, F 8am or 1:30pm, Tues 9a or 1:30 - first come/first served. Services: Simple extractions, temporary fillings, uncomplicated abscess drainage.  You do not need to be an [place] resident. Payment Options: PAYMENT IS DUE AT THE TIME OF SERVICE. Dental insurance, otherwise sliding scale - bring proof of income or support. Depending on income and treatment needed, cost is usually $50-200. Best way to get seen: Arrive early as it is first come/first served.     [facility] [phone]   Location: [address] Clinic Hours: Mon-Thu 8a-5p Services: Most basic dental services including extractions and fillings. Payment Options: PAYMENT IS DUE AT THE TIME OF SERVICE. Sliding scale, up to 50% off - bring proof if income or support. Medicaid with dental option accepted. Best way to get seen: Call to schedule an appointment, can usually be seen within 2 weeks OR they will try to see walk-ins - show up at 8a or 2p (you may have to wait).     [facility] [phone] [place] RESIDENTS ONLY   Location: [facility], [address], [place], [place] [id number] Clinic Hours: By appointment only. Monday - Thursday 8am-5pm, Friday 8am-12pm Services: Cleanings, fillings, extractions. Payment Options: PAYMENT IS DUE AT THE TIME OF SERVICE. Cash, Visa or MasterCard. Sliding scale - $30 minimum per service. Best way to get seen: Come in to office, complete packet and make an appointment - need proof of income or support monies for each household member and proof of [facility] [place] residence. Usually takes about a month to get in.  [facility] [phone]   Location: [address],  [place] Clinic Hours: Walk-in Urgent Care Dental Services are offered Monday-Friday mornings only. The numbers of emergencies accepted daily is limited to the number of providers available. Maximum 15 - Mondays, Wednesdays & Thursdays Maximum 10 - Tuesdays & Fridays Services: You do not need to be a [facility] resident to be seen for a dental emergency. Emergencies are defined as pain, swelling, abnormal bleeding, or dental trauma. Walkins will receive x-rays if needed. NOTE: Dental cleaning is not an emergency. Payment Options: PAYMENT IS DUE AT THE TIME OF SERVICE. Minimum co-pay is $40.00 for uninsured patients. Minimum co-pay is $3.00 for Medicaid with dental coverage. Dental Insurance is accepted and must be presented at time of visit. Medicare does not cover dental. Forms of payment: Cash, credit card, checks. Best way to get seen: If not previously registered with the clinic, walk-in dental registration begins at 7:15 am and is on a first come/first serve basis. If previously registered with the clinic, call to make an appointment.     [facility] [place] ONLY   Location: [address], [place], [place] Clinic Hours: Mon-Thu 10a-2p Services: Extractions only! Payment Options: FREE (donations accepted) - bring proof of income or support Best way to get seen: Call and schedule an appointment OR come at 8am on the 1st Monday of every month (except for holidays) when it is first come/first served.     [facility] [phone]   Location: [place], [place] Clinic Hours: Friday mornings Services, Payment Options, Best way to get seen: Call for info

## 2015-11-05 NOTE — ED Notes (Signed)
Pt c/o pain to left lower side of his mouth; seen here 2 days ago for same; put on antibiotic but pain and swelling has increased;

## 2015-11-07 ENCOUNTER — Emergency Department (HOSPITAL_COMMUNITY)
Admission: EM | Admit: 2015-11-07 | Discharge: 2015-11-07 | Discharge status: Against Medical Advice | Payer: BLUE CROSS/BLUE SHIELD | Attending: Emergency Medicine | Admitting: Emergency Medicine

## 2015-11-07 ENCOUNTER — Encounter (HOSPITAL_COMMUNITY): Payer: Self-pay | Admitting: Emergency Medicine

## 2015-11-07 DIAGNOSIS — K0889 Other specified disorders of teeth and supporting structures: Secondary | ICD-10-CM | POA: Diagnosis present

## 2015-11-07 DIAGNOSIS — F17210 Nicotine dependence, cigarettes, uncomplicated: Secondary | ICD-10-CM | POA: Diagnosis not present

## 2015-11-07 MED ORDER — OXYCODONE-ACETAMINOPHEN 5-325 MG PO TABS
1.0000 | ORAL_TABLET | Freq: Once | ORAL | Status: AC
  Administered 2015-11-07: 1 via ORAL

## 2015-11-07 MED ORDER — OXYCODONE-ACETAMINOPHEN 5-325 MG PO TABS
ORAL_TABLET | ORAL | Status: DC
Start: 2015-11-07 — End: 2015-11-07
  Filled 2015-11-07: qty 1

## 2015-11-07 NOTE — ED Notes (Signed)
Pt from home for eval of dental pain to left lower tooth x6 days, states was given antibiotics but are not working. Pt states swelling has gotten worse and now unable to swallow. Airway intact. Swelling noted to left lower jaw. nad noted.

## 2015-11-08 ENCOUNTER — Emergency Department
Admission: EM | Admit: 2015-11-08 | Discharge: 2015-11-08 | Disposition: A | Discharge status: Home / Self Care | Payer: BLUE CROSS/BLUE SHIELD | Attending: Emergency Medicine | Admitting: Emergency Medicine

## 2015-11-08 ENCOUNTER — Encounter: Payer: Self-pay | Admitting: Emergency Medicine

## 2015-11-08 ENCOUNTER — Telehealth: Payer: Self-pay | Admitting: Emergency Medicine

## 2015-11-08 DIAGNOSIS — K029 Dental caries, unspecified: Secondary | ICD-10-CM | POA: Insufficient documentation

## 2015-11-08 DIAGNOSIS — Z792 Long term (current) use of antibiotics: Secondary | ICD-10-CM | POA: Diagnosis not present

## 2015-11-08 DIAGNOSIS — Z880 Allergy status to penicillin: Secondary | ICD-10-CM | POA: Diagnosis not present

## 2015-11-08 DIAGNOSIS — K047 Periapical abscess without sinus: Secondary | ICD-10-CM | POA: Diagnosis present

## 2015-11-08 DIAGNOSIS — F17210 Nicotine dependence, cigarettes, uncomplicated: Secondary | ICD-10-CM | POA: Diagnosis not present

## 2015-11-08 NOTE — Discharge Instructions (Signed)
Dental Abscess °A dental abscess is a collection of pus in or around a tooth. °CAUSES °This condition is caused by a bacterial infection around the root of the tooth that involves the inner part of the tooth (pulp). It may result from: °· Severe tooth decay. °· Trauma to the tooth that allows bacteria to enter into the pulp, such as a broken or chipped tooth. °· Severe gum disease around a tooth. °SYMPTOMS °Symptoms of this condition include: °· Severe pain in and around the infected tooth. °· Swelling and redness around the infected tooth, in the mouth, or in the face. °· Tenderness. °· Pus drainage. °· Bad breath. °· Bitter taste in the mouth. °· Difficulty swallowing. °· Difficulty opening the mouth. °· Nausea. °· Vomiting. °· Chills. °· Swollen neck glands. °· Fever. °DIAGNOSIS °This condition is diagnosed with examination of the infected tooth. During the exam, your dentist may tap on the infected tooth. Your dentist will also ask about your medical and dental history and may order X-rays. °TREATMENT °This condition is treated by eliminating the infection. This may be done with: °· Antibiotic medicine. °· A root canal. This may be performed to save the tooth. °· Pulling (extracting) the tooth. This may also involve draining the abscess. This is done if the tooth cannot be saved. °HOME CARE INSTRUCTIONS °· Take medicines only as directed by your dentist. °· If you were prescribed antibiotic medicine, finish all of it even if you start to feel better. °· Rinse your mouth (gargle) often with salt water to relieve pain or swelling. °· Do not drive or operate heavy machinery while taking pain medicine. °· Do not apply heat to the outside of your mouth. °· Keep all follow-up visits as directed by your dentist. This is important. °SEEK MEDICAL CARE IF: °· Your pain is worse and is not helped by medicine. °SEEK IMMEDIATE MEDICAL CARE IF: °· You have a fever or chills. °· Your symptoms suddenly get worse. °· You have a  very bad headache. °· You have problems breathing or swallowing. °· You have trouble opening your mouth. °· You have swelling in your neck or around your eye. °  °This information is not intended to replace advice given to you by your health care provider. Make sure you discuss any questions you have with your health care provider. °  °Document Released: 10/27/2005 Document Revised: 03/13/2015 Document Reviewed: 10/24/2014 °Elsevier Interactive Patient Education ©2016 Elsevier Inc. ° °Dental Care and Dentist Visits °Dental care supports good overall health. Regular dental visits can also help you avoid dental pain, bleeding, infection, and other more serious health problems in the future. It is important to keep the mouth healthy because diseases in the teeth, gums, and other oral tissues can spread to other areas of the body. Some problems, such as diabetes, heart disease, and pre-term labor have been associated with poor oral health.  °See your dentist every 6 months. If you experience emergency problems such as a toothache or broken tooth, go to the dentist right away. If you see your dentist regularly, you may catch problems early. It is easier to be treated for problems in the early stages.  °WHAT TO EXPECT AT A DENTIST VISIT  °Your dentist will look for many common oral health problems and recommend proper treatment. At your regular dental visit, you can expect: °· Gentle cleaning of the teeth and gums. This includes scraping and polishing. This helps to remove the sticky substance around the teeth and gums (  plaque). Plaque forms in the mouth shortly after eating. Over time, plaque hardens on the teeth as tartar. If tartar is not removed regularly, it can cause problems. Cleaning also helps remove stains. °· Periodic X-rays. These pictures of the teeth and supporting bone will help your dentist assess the health of your teeth. °· Periodic fluoride treatments. Fluoride is a natural mineral shown to help  strengthen teeth. Fluoride treatment involves applying a fluoride gel or varnish to the teeth. It is most commonly done in children. °· Examination of the mouth, tongue, jaws, teeth, and gums to look for any oral health problems, such as: °· Cavities (dental caries). This is decay on the tooth caused by plaque, sugar, and acid in the mouth. It is best to catch a cavity when it is small. °· Inflammation of the gums caused by plaque buildup (gingivitis). °· Problems with the mouth or malformed or misaligned teeth. °· Oral cancer or other diseases of the soft tissues or jaws.  °KEEP YOUR TEETH AND GUMS HEALTHY °For healthy teeth and gums, follow these general guidelines as well as your dentist's specific advice: °· Have your teeth professionally cleaned at the dentist every 6 months. °· Brush twice daily with a fluoride toothpaste. °· Floss your teeth daily.  °· Ask your dentist if you need fluoride supplements, treatments, or fluoride toothpaste. °· Eat a healthy diet. Reduce foods and drinks with added sugar. °· Avoid smoking. °TREATMENT FOR ORAL HEALTH PROBLEMS °If you have oral health problems, treatment varies depending on the conditions present in your teeth and gums. °· Your caregiver will most likely recommend good oral hygiene at each visit. °· For cavities, gingivitis, or other oral health disease, your caregiver will perform a procedure to treat the problem. This is typically done at a separate appointment. Sometimes your caregiver will refer you to another dental specialist for specific tooth problems or for surgery. °SEEK IMMEDIATE DENTAL CARE IF: °· You have pain, bleeding, or soreness in the gum, tooth, jaw, or mouth area. °· A permanent tooth becomes loose or separated from the gum socket. °· You experience a blow or injury to the mouth or jaw area. °  °This information is not intended to replace advice given to you by your health care provider. Make sure you discuss any questions you have with your  health care provider. °  °Document Released: 07/09/2011 Document Revised: 01/19/2012 Document Reviewed: 07/09/2011 °Elsevier Interactive Patient Education ©2016 Elsevier Inc. ° °Dental Caries °Dental caries (also called tooth decay) is the most common oral disease. It can occur at any age but is more common in children and young adults.  °HOW DENTAL CARIES DEVELOPS  °The process of decay begins when bacteria and foods (particularly sugars and starches) combine in your mouth to produce plaque. Plaque is a substance that sticks to the hard, outer surface of a tooth (enamel). The bacteria in plaque produce acids that attack enamel. These acids may also attack the root surface of a tooth (cementum) if it is exposed. Repeated attacks dissolve these surfaces and create holes in the tooth (cavities). If left untreated, the acids destroy the other layers of the tooth.  °RISK FACTORS °· Frequent sipping of sugary beverages.   °· Frequent snacking on sugary and starchy foods, especially those that easily get stuck in the teeth.   °· Poor oral hygiene.   °· Dry mouth.   °· Substance abuse such as methamphetamine abuse.   °· Broken or poor-fitting dental restorations.   °· Eating disorders.   °· Gastroesophageal reflux disease (GERD).   °·   Certain radiation treatments to the head and neck. SYMPTOMS In the early stages of dental caries, symptoms are seldom present. Sometimes white, chalky areas may be seen on the enamel or other tooth layers. In later stages, symptoms may include:  Pits and holes on the enamel.  Toothache after sweet, hot, or cold foods or drinks are consumed.  Pain around the tooth.  Swelling around the tooth. DIAGNOSIS  Most of the time, dental caries is detected during a regular dental checkup. A diagnosis is made after a thorough medical and dental history is taken and the surfaces of your teeth are checked for signs of dental caries. Sometimes special instruments, such as lasers, are used to  check for dental caries. Dental X-ray exams may be taken so that areas not visible to the eye (such as between the contact areas of the teeth) can be checked for cavities.  TREATMENT  If dental caries is in its early stages, it may be reversed with a fluoride treatment or an application of a remineralizing agent at the dental office. Thorough brushing and flossing at home is needed to aid these treatments. If it is in its later stages, treatment depends on the location and extent of tooth destruction:   If a small area of the tooth has been destroyed, the destroyed area will be removed and cavities will be filled with a material such as gold, silver amalgam, or composite resin.   If a large area of the tooth has been destroyed, the destroyed area will be removed and a cap (crown) will be fitted over the remaining tooth structure.   If the center part of the tooth (pulp) is affected, a procedure called a root canal will be needed before a filling or crown can be placed.   If most of the tooth has been destroyed, the tooth may need to be pulled (extracted). HOME CARE INSTRUCTIONS You can prevent, stop, or reverse dental caries at home by practicing good oral hygiene. Good oral hygiene includes:  Thoroughly cleaning your teeth at least twice a day with a toothbrush and dental floss.   Using a fluoride toothpaste. A fluoride mouth rinse may also be used if recommended by your dentist or health care provider.   Restricting the amount of sugary and starchy foods and sugary liquids you consume.   Avoiding frequent snacking on these foods and sipping of these liquids.   Keeping regular visits with a dentist for checkups and cleanings. PREVENTION   Practice good oral hygiene.  Consider a dental sealant. A dental sealant is a coating material that is applied by your dentist to the pits and grooves of teeth. The sealant prevents food from being trapped in them. It may protect the teeth for  several years.  Ask about fluoride supplements if you live in a community without fluorinated water or with water that has a low fluoride content. Use fluoride supplements as directed by your dentist or health care provider.  Allow fluoride varnish applications to teeth if directed by your dentist or health care provider.   This information is not intended to replace advice given to you by your health care provider. Make sure you discuss any questions you have with your health care provider.   Document Released: 07/19/2002 Document Revised: 11/17/2014 Document Reviewed: 10/29/2012 Elsevier Interactive Patient Education [employer]2016 Elsevier Inc.

## 2015-11-08 NOTE — ED Notes (Addendum)
Patient ambulatory to triage with steady gait, without difficulty or distress noted; 4th visit here for dental "abscess"; st area burst tonight and "somebody told me to come in because I could have a stroke"

## 2015-11-08 NOTE — ED Provider Notes (Signed)
[facility] Emergency Department Provider Note  ____________________________________________  Time seen: Approximately 435 AM  I have reviewed the triage vital signs and the nursing notes.   HISTORY  Chief Complaint Abscess    HPI Lee Ruiz is a 34 y.o. male who comes into the hospital today with an abscessed tooth. The patient reports that he was here Friday and given clindamycin, oxycodone and ibuprofen. He reports that the abscess got bigger and bigger and today the abscess ruptured. He noticed the abscess on the left side of his face. He reports that he has been trying to find a dentist. He was at [facility] yesterday and did not get to see a physician. He called the [facility] dental school but they're out until January. He reports that he couldn't go to [facility]. He is asking if there is a way he can get stronger antibiotics because he doesn't feel that his antibiotics are helping. He reports that he has something set up and try on street in [facility] but he is unable to eat his mouth hurts a lot. He reports that when the abscess ruptured his left-sided facial pain improved. He reports though that it seems as though the swelling is returning. The patient needs a doctor's note for work as he has not been at work for multiple days. He is also been stressed out and he doesn't know what to do. He reports that he was going to go to [facility] but he was afraid he might have a stroke because the abscess ruptured so he decided to come in for evaluation.The patient rates his pain as a 7/10 in intensity. He reports that he has more pain medicine and antibiotics at home.    Past Medical History  Diagnosis Date  . Anxiety   . Migraines     There are no active problems to display for this patient.   History reviewed. No pertinent past surgical history.  Current Outpatient Rx  Name  Route  Sig  Dispense  Refill  . clindamycin (CLEOCIN) 150 MG capsule   Oral   Take 1  capsule (150 mg total) by mouth every 6 (six) hours.   28 capsule   0   . clindamycin (CLEOCIN) 300 MG capsule   Oral   Take 1 capsule (300 mg total) by mouth 3 (three) times daily.   30 capsule   0   . HYDROcodone-acetaminophen (NORCO/VICODIN) 5-325 MG per tablet   Oral   Take 2 tablets by mouth every 4 (four) hours as needed.   10 tablet   0   . ibuprofen (ADVIL,MOTRIN) 600 MG tablet   Oral   Take 1 tablet (600 mg total) by mouth every 8 (eight) hours as needed for mild pain or moderate pain.   15 tablet   0   . ibuprofen (ADVIL,MOTRIN) 800 MG tablet   Oral   Take 1 tablet (800 mg total) by mouth every 8 (eight) hours as needed.   30 tablet   0   . oxyCODONE-acetaminophen (ROXICET) 5-325 MG per tablet   Oral   Take 1 tablet by mouth every 8 (eight) hours as needed for moderate pain or severe pain (Do not drive or operate heavy machinery while taking as can cause drowsiness.).   9 tablet   0   . oxyCODONE-acetaminophen (ROXICET) 5-325 MG tablet   Oral   Take 1 tablet by mouth every 6 (six) hours as needed.   20 tablet  0     Allergies Penicillins; Sulfa antibiotics; Tramadol; and Vicodin  Family History  Problem Relation Age of Onset  . Cancer Mother   . CAD Other     Social History Social History  Substance Use Topics  . Smoking status: Current Every Day Smoker -- 1.00 packs/day    Types: Cigarettes  . Smokeless tobacco: None  . Alcohol Use: Yes     Comment: qd    Review of Systems Constitutional:  fever/chills Eyes: No visual changes. ENT: dental pain Cardiovascular: Denies chest pain. Respiratory: Denies shortness of breath. Gastrointestinal: No abdominal pain.  No nausea, no vomiting.  No diarrhea.  No constipation. Genitourinary: Negative for dysuria. Musculoskeletal: Negative for back pain. Skin: Negative for rash. Neurological: Negative for headaches, focal weakness or numbness.  10-point ROS otherwise  negative.  ____________________________________________   PHYSICAL EXAM:  VITAL SIGNS: ED Triage Vitals  Enc Vitals Group     BP 11/08/15 0349 115/83 mmHg     Pulse Rate 11/08/15 0349 84     Resp 11/08/15 0349 18     Temp 11/08/15 0349 98.4 F (36.9 C)     Temp Source 11/08/15 0349 Oral     SpO2 11/08/15 0349 99 %     Weight 11/08/15 0349 140 lb (63.504 kg)     Height 11/08/15 0349 6' (1.829 m)     Head Cir --      Peak Flow --      Pain Score 11/08/15 0349 7     Pain Loc --      Pain Edu? --      Excl. in GC? --     Constitutional: Alert and oriented. Well appearing and in mild distress. Eyes: Conjunctivae are normal. PERRL. EOMI. Head: Atraumatic. Nose: No congestion/rhinnorhea. Mouth/Throat: Mucous membranes are moist.  Oropharynx non-erythematous. Poor dentition with mild soft tissue swelling to left gumline. Erythematous area indication location of previous rupture.  Cardiovascular: Normal rate, regular rhythm. Grossly normal heart sounds.  Good peripheral circulation. Respiratory: Normal respiratory effort.  No retractions. Lungs CTAB. Gastrointestinal: Soft and nontender. No distention. Positive bowel sounds Musculoskeletal: No lower extremity tenderness nor edema.   Neurologic:  Normal speech and language.  Skin:  Skin is warm, dry and intact.  Psychiatric: Mood and affect are normal.   ____________________________________________   LABS (all labs ordered are listed, but only abnormal results are displayed)  Labs Reviewed - No data to display ____________________________________________  EKG  none ____________________________________________  RADIOLOGY  none ____________________________________________   PROCEDURES  Procedure(s) performed: None  Critical Care performed: No  ____________________________________________   INITIAL IMPRESSION / ASSESSMENT AND PLAN / ED COURSE  Pertinent labs & imaging results that were available during my care  of the patient were reviewed by me and considered in my medical decision making (see chart for details).  This is a 34 year old male who comes into the hospital today with an abscessed tooth. The patient has been seen multiple times in the past but his tooth. The patient has not yet followed up with dentist. He came in because he was afraid that by abscessed rupturing it was going to give him a stroke or put poison in his blood. The patient reports that he was afraid to sleep because he Swallowing the poison. I explained to the patient that clindamycin is the best medication that he could be on at this time given his penicillin allergy and that he needs to find a dentist who can extract his tooth  and help to drain the abscess. I informed him that antibiotics are only a temporizing measure and that getting his teeth fixed would help the symptoms. The patient has pain medicine at home and he also has remaining antibiotics. I encouraged [facility] or at the clinic he has an appointment for tomorrow to see if he can be evaluated by a dentist. The patient understands and agrees with the plan and he will be discharged home. ____________________________________________   FINAL CLINICAL IMPRESSION(S) / ED DIAGNOSES  Final diagnoses:  Dental abscess  Dental caries      Rebecka ApleyAllison P Webster, MD 11/08/15 [phone]

## 2015-11-08 NOTE — ED Notes (Signed)
Pt called and asking for referral to [facility] for dentist.  I called and that clinic is for specialty patients and in patients, and would not be able to see him in office now.  [facility] is closed this week for the holiday.  I called the pateint back and explained that if he is improving, he can wait and call the urgent care on Monday/Tuesday and get appointment.  If he is getting worse, he is to go to the ED at [facility] or he may return here if needed.

## 2016-05-16 ENCOUNTER — Encounter: Payer: Self-pay | Admitting: *Deleted

## 2016-05-16 ENCOUNTER — Emergency Department
Admission: EM | Admit: 2016-05-16 | Discharge: 2016-05-16 | Disposition: A | Discharge status: Home / Self Care | Payer: BLUE CROSS/BLUE SHIELD | Attending: Emergency Medicine | Admitting: Emergency Medicine

## 2016-05-16 DIAGNOSIS — K529 Noninfective gastroenteritis and colitis, unspecified: Secondary | ICD-10-CM | POA: Insufficient documentation

## 2016-05-16 DIAGNOSIS — F17210 Nicotine dependence, cigarettes, uncomplicated: Secondary | ICD-10-CM | POA: Diagnosis not present

## 2016-05-16 DIAGNOSIS — R197 Diarrhea, unspecified: Secondary | ICD-10-CM | POA: Diagnosis present

## 2016-05-16 DIAGNOSIS — F1290 Cannabis use, unspecified, uncomplicated: Secondary | ICD-10-CM | POA: Diagnosis not present

## 2016-05-16 MED ORDER — ONDANSETRON 4 MG PO TBDP
4.0000 mg | ORAL_TABLET | Freq: Once | ORAL | Status: AC
  Administered 2016-05-16: 4 mg via ORAL

## 2016-05-16 MED ORDER — CLINDAMYCIN HCL 300 MG PO CAPS: 300.0000 mg | ORAL_CAPSULE | Freq: Three times a day (TID) | ORAL | Status: DC

## 2016-05-16 MED ORDER — ONDANSETRON HCL 4 MG PO TABS: 4.0000 mg | ORAL_TABLET | Freq: Every day | ORAL | Status: DC | PRN

## 2016-05-16 MED ORDER — ONDANSETRON 4 MG PO TBDP
ORAL_TABLET | ORAL | Status: AC
  Administered 2016-05-16: 4 mg via ORAL
  Filled 2016-05-16: qty 1

## 2016-05-16 NOTE — ED Notes (Signed)
Pt to ED with n/v/d and dental pain x 3 days. Pt states people at work have been ill with stomach virus. Vitals stable, NAD noted at this time.

## 2016-05-16 NOTE — ED Provider Notes (Signed)
[facility] Emergency Department Provider Note   ____________________________________________    I have reviewed the triage vital signs and the nursing notes.   HISTORY  Chief Complaint Dental Pain; Nausea; Emesis; and Diarrhea     HPI Lee Ruiz is a 35 y.o. male who presents with nausea vomiting and diarrhea. He reports his symptoms started 2 days ago. He reports he had sick contacts with similar symptoms. He has intermittent abdominal cramping prior to a bowel movement but otherwise has no pain. No fevers or chills. Mild bodyaches noted. No recent travel. He also complains of dental pain which is somewhat chronic for him. [facility]. No intraoral swelling or throat swelling   Past Medical History  Diagnosis Date  . Anxiety   . Migraines     There are no active problems to display for this patient.   History reviewed. No pertinent past surgical history.  Current Outpatient Rx  Name  Route  Sig  Dispense  Refill  . clindamycin (CLEOCIN) 300 MG capsule   Oral   Take 1 capsule (300 mg total) by mouth 3 (three) times daily.   21 capsule   0   . HYDROcodone-acetaminophen (NORCO/VICODIN) 5-325 MG per tablet   Oral   Take 2 tablets by mouth every 4 (four) hours as needed.   10 tablet   0   . ibuprofen (ADVIL,MOTRIN) 600 MG tablet   Oral   Take 1 tablet (600 mg total) by mouth every 8 (eight) hours as needed for mild pain or moderate pain.   15 tablet   0   . ibuprofen (ADVIL,MOTRIN) 800 MG tablet   Oral   Take 1 tablet (800 mg total) by mouth every 8 (eight) hours as needed.   30 tablet   0   . ondansetron (ZOFRAN) 4 MG tablet   Oral   Take 1 tablet (4 mg total) by mouth daily as needed for nausea or vomiting.   20 tablet   1   . oxyCODONE-acetaminophen (ROXICET) 5-325 MG per tablet   Oral   Take 1 tablet by mouth every 8 (eight) hours as needed for moderate pain or severe pain (Do not  drive or operate heavy machinery while taking as can cause drowsiness.).   9 tablet   0   . oxyCODONE-acetaminophen (ROXICET) 5-325 MG tablet   Oral   Take 1 tablet by mouth every 6 (six) hours as needed.   20 tablet   0     Allergies Penicillins; Sulfa antibiotics; Tramadol; and Vicodin  Family History  Problem Relation Age of Onset  . Cancer Mother   . CAD Other     Social History Social History  Substance Use Topics  . Smoking status: Current Every Day Smoker -- 1.00 packs/day    Types: Cigarettes  . Smokeless tobacco: None  . Alcohol Use: Yes     Comment: qd    Review of Systems  Constitutional: No fever/chills Eyes: No visual changes. No discharge ENT: No sore throat.Dental pain as above Cardiovascular: Denies chest pain. Respiratory: Denies shortness of breath. Gastrointestinal: As above Genitourinary: Negative for dysuria. Musculoskeletal: Negative for back pain. Mild bodyaches Skin: Negative for rash. Neurological: Negative for headaches or weakness  10-point ROS otherwise negative.  ____________________________________________   PHYSICAL EXAM:  VITAL SIGNS: ED Triage Vitals  Enc Vitals Group     BP 05/16/16 1720 119/81 mmHg     Pulse Rate  05/16/16 1720 77     Resp 05/16/16 1720 18     Temp 05/16/16 1720 98.3 F (36.8 C)     Temp Source 05/16/16 1720 Oral     SpO2 05/16/16 1720 97 %     Weight 05/16/16 1720 135 lb (61.236 kg)     Height 05/16/16 1720 6' (1.829 m)     Head Cir --      Peak Flow --      Pain Score 05/16/16 1720 5     Pain Loc --      Pain Edu? --      Excl. in GC? --     Constitutional: Alert and oriented. No acute distress.  Eyes: Conjunctivae are normal.  Head: Atraumatic.Normocephalic Nose: No congestion/rhinnorhea. Mouth/Throat: Mucous membranes are moist.  Oropharynx non-erythematous.Very poor dentition, no intraoral swelling Neck: No stridor. Painless ROM Cardiovascular: Normal rate, regular rhythm. Grossly  normal heart sounds.  Good peripheral circulation. Respiratory: Normal respiratory effort.  No retractions. Lungs CTAB. Gastrointestinal: Soft and nontender. No distention.  No CVA tenderness. Genitourinary: deferred Musculoskeletal: No lower extremity tenderness nor edema.  Warm and well perfused Neurologic:  Normal speech and language. No gross focal neurologic deficits are appreciated.  Skin:  Skin is warm, dry and intact. No rash noted. Psychiatric: Mood and affect are normal. Speech and behavior are normal.  ____________________________________________   LABS (all labs ordered are listed, but only abnormal results are displayed)  Labs Reviewed - No data to display ____________________________________________  EKG  None ____________________________________________  RADIOLOGY  None ____________________________________________   PROCEDURES  Procedure(s) performed: No    Critical Care performed: No ____________________________________________   INITIAL IMPRESSION / ASSESSMENT AND PLAN / ED COURSE  Pertinent labs & imaging results that were available during my care of the patient were reviewed by me and considered in my medical decision making (see chart for details).  Patient resents with nausea vomiting diarrhea. Vital signs are normal. No evidence of dehydration on exam. We will treat with by mouth Zofran. I will also prescribe him clindamycin for likely early dental infection prior to being seen by dentistry. Return precautions discussed. ____________________________________________   FINAL CLINICAL IMPRESSION(S) / ED DIAGNOSES  Final diagnoses:  Gastroenteritis      NEW MEDICATIONS STARTED DURING THIS VISIT:  Discharge Medication List as of 05/16/2016  5:45 PM    START taking these medications   Details  ondansetron (ZOFRAN) 4 MG tablet Take 1 tablet (4 mg total) by mouth daily as needed for nausea or vomiting., Starting 05/16/2016, Until Discontinued,  Print         Note:  This document was prepared using Dragon voice recognition software and may include unintentional dictation errors.    Jene Everyobert Amariss Detamore, MD 05/16/16 [phone]

## 2016-05-16 NOTE — ED Notes (Signed)
Pt alert and oriented X4, active, cooperative, pt in NAD. RR even and unlabored, color WNL.  Pt informed to return if any life threatening symptoms occur.   

## 2016-05-16 NOTE — ED Notes (Signed)
Nausea and diarrhea intermittently x 2 days; other people around him at work are sick with same. Dental pain. Pt alert and oriented X4, active, cooperative, pt in NAD. RR even and unlabored, color WNL.

## 2016-10-06 ENCOUNTER — Emergency Department
Admission: EM | Admit: 2016-10-06 | Discharge: 2016-10-06 | Disposition: A | Discharge status: Home / Self Care | Payer: BLUE CROSS/BLUE SHIELD | Attending: Emergency Medicine | Admitting: Emergency Medicine

## 2016-10-06 ENCOUNTER — Encounter: Payer: Self-pay | Admitting: *Deleted

## 2016-10-06 DIAGNOSIS — R509 Fever, unspecified: Secondary | ICD-10-CM | POA: Diagnosis present

## 2016-10-06 DIAGNOSIS — R6889 Other general symptoms and signs: Secondary | ICD-10-CM

## 2016-10-06 DIAGNOSIS — A084 Viral intestinal infection, unspecified: Secondary | ICD-10-CM | POA: Diagnosis not present

## 2016-10-06 DIAGNOSIS — F17210 Nicotine dependence, cigarettes, uncomplicated: Secondary | ICD-10-CM | POA: Diagnosis not present

## 2016-10-06 MED ORDER — PSEUDOEPH-BROMPHEN-DM 30-2-10 MG/5ML PO SYRP: 10.0000 mL | ORAL_SOLUTION | Freq: Four times a day (QID) | ORAL | 0 refills | Status: DC | PRN

## 2016-10-06 MED ORDER — LOPERAMIDE HCL 2 MG PO TABS: 2.0000 mg | ORAL_TABLET | Freq: Four times a day (QID) | ORAL | 0 refills | Status: DC | PRN

## 2016-10-06 MED ORDER — ONDANSETRON 4 MG PO TBDP: 4.0000 mg | ORAL_TABLET | Freq: Three times a day (TID) | ORAL | 0 refills | Status: DC | PRN

## 2016-10-06 NOTE — ED Triage Notes (Signed)
Pt reports fever, chills, cough for 1 day.  Pt took tylenol at 1600 today.  Pt alert.

## 2016-10-06 NOTE — ED Notes (Addendum)
Pt reports chills fever nausea vomiting diarrhea weakness and cough since yesterday. Pt reports pain 4/10 generalized pain. Pt seen by provider and goingto be discharged.

## 2016-10-06 NOTE — ED Notes (Signed)
Discharge instructions reviewed with patient. Questions fielded by this RN. Patient verbalizes understanding of instructions. Patient discharged home in stable condition per Cuthriell PA . No acute distress noted at time of discharge.   

## 2016-10-06 NOTE — ED Provider Notes (Signed)
[name] Emergency Department Provider Note  ____________________________________________  Time seen: Approximately 7:36 PM  I have reviewed the triage vital signs and the nursing notes.   HISTORY  Chief Complaint Influenza    HPI Lee Ruiz is a 35 y.o. male who presents emergency Department with multiple complaints. Patient states her last days he has developed nasal congestion, sore throat, coughing, myalgias, general aches, fevers and chills, nausea and vomiting, diarrhea. Patient states that symptoms have been insidious in nature. Patient does report some diffuse abdominal pain that is intermittent in nature. He states that it is worse after vomiting. Patient denies any hematemesis or hematochezia. Patient states that he does work in [employer] and has had multiple sick contacts. Patient has tried Tylenol which she states does improve his symptoms somewhat. No other medications prior to arrival.Patient states that he is able to maintain good oral intake of liquids and solids.   Past Medical History:  Diagnosis Date  . Anxiety   . Migraines     There are no active problems to display for this patient.   No past surgical history on file.  Prior to Admission medications   Medication Sig Start Date End Date Taking? Authorizing Provider  brompheniramine-pseudoephedrine-DM 30-2-10 MG/5ML syrup Take 10 mLs by mouth 4 (four) times daily as needed. 10/06/16   Delorise RoyalsJonathan D Cuthriell, PA-C  clindamycin (CLEOCIN) 300 MG capsule Take 1 capsule (300 mg total) by mouth 3 (three) times daily. 05/16/16   Jene Everyobert Kinner, MD  HYDROcodone-acetaminophen (NORCO/VICODIN) 5-325 MG per tablet Take 2 tablets by mouth every 4 (four) hours as needed. 01/11/15   Teressa LowerVrinda Pickering, NP  ibuprofen (ADVIL,MOTRIN) 600 MG tablet Take 1 tablet (600 mg total) by mouth every 8 (eight) hours as needed for mild pain or moderate pain. 05/24/15   Renford DillsLindsey Miller, NP  ibuprofen  (ADVIL,MOTRIN) 800 MG tablet Take 1 tablet (800 mg total) by mouth every 8 (eight) hours as needed. 11/03/15   Arnaldo NatalPaul F Malinda, MD  loperamide (IMODIUM A-D) 2 MG tablet Take 1 tablet (2 mg total) by mouth 4 (four) times daily as needed for diarrhea or loose stools. 10/06/16   Delorise RoyalsJonathan D Cuthriell, PA-C  ondansetron (ZOFRAN) 4 MG tablet Take 1 tablet (4 mg total) by mouth daily as needed for nausea or vomiting. 05/16/16   Jene Everyobert Kinner, MD  ondansetron (ZOFRAN-ODT) 4 MG disintegrating tablet Take 1 tablet (4 mg total) by mouth every 8 (eight) hours as needed for nausea or vomiting. 10/06/16   Delorise RoyalsJonathan D Cuthriell, PA-C  oxyCODONE-acetaminophen (ROXICET) 5-325 MG per tablet Take 1 tablet by mouth every 8 (eight) hours as needed for moderate pain or severe pain (Do not drive or operate heavy machinery while taking as can cause drowsiness.). 05/24/15   Renford DillsLindsey Miller, NP  oxyCODONE-acetaminophen (ROXICET) 5-325 MG tablet Take 1 tablet by mouth every 6 (six) hours as needed. 11/03/15 11/02/16  Arnaldo NatalPaul F Malinda, MD    Allergies Penicillins; Sulfa antibiotics; Tramadol; and Vicodin [hydrocodone-acetaminophen]  Family History  Problem Relation Age of Onset  . Cancer Mother   . CAD Other     Social History Social History  Substance Use Topics  . Smoking status: Current Every Day Smoker    Packs/day: 1.00    Types: Cigarettes  . Smokeless tobacco: Never Used  . Alcohol use Yes     Comment: qd     Review of Systems  Constitutional:Positive for intermittent low-grade fever/chills Eyes: No visual changes. No discharge ENT: Nasal  congestion, sore throat. Cardiovascular: no chest pain. Respiratory: Positive cough. No SOB. Gastrointestinal: Positive for diffuse, intermittent abdominal pain.  Positive nausea, vomiting, diarrhea.  No constipation. Musculoskeletal: Negative for musculoskeletal pain. Skin: Negative for rash, abrasions, lacerations, ecchymosis. Neurological: Negative for headaches, focal  weakness or numbness. 10-point ROS otherwise negative.  ____________________________________________   PHYSICAL EXAM:  VITAL SIGNS: ED Triage Vitals  Enc Vitals Group     BP 10/06/16 1917 122/80     Pulse Rate 10/06/16 1917 80     Resp 10/06/16 1917 20     Temp 10/06/16 1917 98.2 F (36.8 C)     Temp Source 10/06/16 1917 Oral     SpO2 10/06/16 1917 98 %     Weight 10/06/16 1919 135 lb (61.2 kg)     Height 10/06/16 1919 6' (1.829 m)     Head Circumference --      Peak Flow --      Pain Score 10/06/16 1919 5     Pain Loc --      Pain Edu? --      Excl. in GC? --      Constitutional: Alert and oriented. Well appearing and in no acute distress. Eyes: Conjunctivae are normal. PERRL. EOMI. Head: Atraumatic. ENT:      Ears: EACs and TMs unremarkable bilaterally.      Nose: Moderate congestion/rhinnorhea.      Mouth/Throat: Mucous membranes are moist. Pharynx is mildly erythematous and non-edematous. Uvula is midline. Postnasal drip is identified. Tonsils are unremarkable bilaterally. Neck: No stridor.  Neck is supple with full range of motion Hematological/Lymphatic/Immunilogical: No cervical lymphadenopathy. Cardiovascular: Normal rate, regular rhythm. Normal S1 and S2.  Good peripheral circulation. Respiratory: Normal respiratory effort without tachypnea or retractions. Lungs CTAB. Good air entry to the bases with no decreased or absent breath sounds. Gastrointestinal: Bowel sounds 4 quadrants. Soft and nontender to palpation. No guarding or rigidity. No palpable masses. No distention. No CVA tenderness. Musculoskeletal: Full range of motion to all extremities. No gross deformities appreciated. Neurologic:  Normal speech and language. No gross focal neurologic deficits are appreciated.  Skin:  Skin is warm, dry and intact. No rash noted. Psychiatric: Mood and affect are normal. Speech and behavior are normal. Patient exhibits appropriate insight and  judgement.   ____________________________________________   LABS (all labs ordered are listed, but only abnormal results are displayed)  Labs Reviewed - No data to display ____________________________________________  EKG   ____________________________________________  RADIOLOGY   No results found.  ____________________________________________    PROCEDURES  Procedure(s) performed:    Procedures    Medications - No data to display   ____________________________________________   INITIAL IMPRESSION / ASSESSMENT AND PLAN / ED COURSE  Pertinent labs & imaging results that were available during my care of the patient were reviewed by me and considered in my medical decision making (see chart for details).  Review of the [place] CSRS was performed in accordance of the [facility] prior to dispensing any controlled drugs.  Clinical Course     Patient's diagnosis is consistent with A flulike illness with viral gastroenteritis. Patient has insidious onset of multiple complaints consistent with viral illness. Patient started with nasal congestion and increasing to include sore throat, coughing, patient chills, then progressed to nausea and vomiting and diarrhea. Exam is reassuring with no acute findings. Symptoms are consistent with viral illness and as such, no labs or imaging are deemed necessary at this time. Patient is maintaining good oral intake of solids  and liquids.. Patient will be discharged home with prescriptions for Bromfed cough syrup, Zofran, loperamide. Patient is to follow up with primary care as needed or otherwise directed. Patient is given ED precautions to return to the ED for any worsening or new symptoms.     ____________________________________________  FINAL CLINICAL IMPRESSION(S) / ED DIAGNOSES  Final diagnoses:  Flu-like symptoms  Viral gastroenteritis      NEW MEDICATIONS STARTED DURING THIS VISIT:  New Prescriptions    BROMPHENIRAMINE-PSEUDOEPHEDRINE-DM 30-2-10 MG/5ML SYRUP    Take 10 mLs by mouth 4 (four) times daily as needed.   LOPERAMIDE (IMODIUM A-D) 2 MG TABLET    Take 1 tablet (2 mg total) by mouth 4 (four) times daily as needed for diarrhea or loose stools.   ONDANSETRON (ZOFRAN-ODT) 4 MG DISINTEGRATING TABLET    Take 1 tablet (4 mg total) by mouth every 8 (eight) hours as needed for nausea or vomiting.        This chart was dictated using voice recognition software/Dragon. Despite best efforts to proofread, errors can occur which can change the meaning. Any change was purely unintentional.    Racheal PatchesJonathan D Cuthriell, PA-C 10/06/16 1950    Jeanmarie PlantJames A McShane, MD 10/08/16 [phone]

## 2016-12-17 ENCOUNTER — Emergency Department: Payer: BLUE CROSS/BLUE SHIELD

## 2016-12-17 ENCOUNTER — Emergency Department
Admission: EM | Admit: 2016-12-17 | Discharge: 2016-12-17 | Disposition: A | Discharge status: Home / Self Care | Payer: BLUE CROSS/BLUE SHIELD | Attending: Student in an Organized Health Care Education/Training Program | Admitting: Student in an Organized Health Care Education/Training Program

## 2016-12-17 ENCOUNTER — Encounter: Payer: Self-pay | Admitting: Emergency Medicine

## 2016-12-17 DIAGNOSIS — J111 Influenza due to unidentified influenza virus with other respiratory manifestations: Secondary | ICD-10-CM | POA: Diagnosis not present

## 2016-12-17 DIAGNOSIS — R05 Cough: Secondary | ICD-10-CM | POA: Diagnosis present

## 2016-12-17 DIAGNOSIS — F17210 Nicotine dependence, cigarettes, uncomplicated: Secondary | ICD-10-CM | POA: Insufficient documentation

## 2016-12-17 MED ORDER — OSELTAMIVIR PHOSPHATE 75 MG PO CAPS
75.0000 mg | ORAL_CAPSULE | Freq: Two times a day (BID) | ORAL | 0 refills | Status: DC
Start: 2016-12-17 — End: 2017-05-29

## 2016-12-17 MED ORDER — FLUTICASONE PROPIONATE 50 MCG/ACT NA SUSP: 2.0000 | Freq: Every day | NASAL | 0 refills | Status: DC

## 2016-12-17 MED ORDER — IPRATROPIUM-ALBUTEROL 0.5-2.5 (3) MG/3ML IN SOLN
3.0000 mL | Freq: Once | RESPIRATORY_TRACT | Status: AC
  Administered 2016-12-17: 3 mL via RESPIRATORY_TRACT
  Filled 2016-12-17: qty 3

## 2016-12-17 MED ORDER — PROMETHAZINE-DM 6.25-15 MG/5ML PO SYRP: 5.0000 mL | ORAL_SOLUTION | Freq: Four times a day (QID) | ORAL | 0 refills | Status: DC | PRN

## 2016-12-17 NOTE — ED Notes (Signed)
Pt presents to ED with c/o cough, fever, chills. Pt states he has been coughing up "some ugly stuff", also c/o blood in his mucous when he blows his nose.

## 2016-12-17 NOTE — ED Triage Notes (Signed)
Pt come into the ED via POV c/o cough and generalized body aches.  Patient states symptoms started yesterday.  Patient states he has had fevers at home but does not present with one today.  Patient in NAD at this time with even and unlabored respirations and good gait to the triage room.

## 2016-12-17 NOTE — ED Provider Notes (Signed)
[facility] Emergency Department Provider Note  ____________________________________________  Time seen: Approximately 1:15 PM  I have reviewed the triage vital signs and the nursing notes.   HISTORY  Chief Complaint Generalized Body Aches and Cough    HPI Lee Ruiz is a 36 y.o. male , NAD, presents to the emergency department with one-day history of flulike symptoms. Patient states he had onset of fever, chills and generalized body aches as of yesterday. Over the course of last 24 hours has had increasing cough and chest congestion and that the cough is productive of thick sputum. Also endorses that he has had episodes of post tussive emesis but has not noted any mucus or blood in the emesis. Denies any chest pain, shortness of breath, abdominal pain, diarrhea. Has been exposed to a family member who was diagnosed with influenza over the last couple weeks. Has been taking over-the-counter medications without any relief of symptoms. Also notes that he has a chronic tobacco user and his continued smoking throughout his illness.   Past Medical History:  Diagnosis Date  . Anxiety   . Migraines     There are no active problems to display for this patient.   History reviewed. No pertinent surgical history.  Prior to Admission medications   Medication Sig Start Date End Date Taking? Authorizing Provider  brompheniramine-pseudoephedrine-DM 30-2-10 MG/5ML syrup Take 10 mLs by mouth 4 (four) times daily as needed. 10/06/16   Delorise RoyalsJonathan D Cuthriell, PA-C  clindamycin (CLEOCIN) 300 MG capsule Take 1 capsule (300 mg total) by mouth 3 (three) times daily. 05/16/16   Jene Everyobert Kinner, MD  fluticasone (FLONASE) 50 MCG/ACT nasal spray Place 2 sprays into both nostrils daily. 12/17/16   Catharine Kettlewell L Khylin Gutridge, PA-C  HYDROcodone-acetaminophen (NORCO/VICODIN) 5-325 MG per tablet Take 2 tablets by mouth every 4 (four) hours as needed. 01/11/15   Teressa LowerVrinda Pickering, NP  ibuprofen  (ADVIL,MOTRIN) 600 MG tablet Take 1 tablet (600 mg total) by mouth every 8 (eight) hours as needed for mild pain or moderate pain. 05/24/15   Renford DillsLindsey Miller, NP  ibuprofen (ADVIL,MOTRIN) 800 MG tablet Take 1 tablet (800 mg total) by mouth every 8 (eight) hours as needed. 11/03/15   Arnaldo NatalPaul F Malinda, MD  loperamide (IMODIUM A-D) 2 MG tablet Take 1 tablet (2 mg total) by mouth 4 (four) times daily as needed for diarrhea or loose stools. 10/06/16   Delorise RoyalsJonathan D Cuthriell, PA-C  ondansetron (ZOFRAN) 4 MG tablet Take 1 tablet (4 mg total) by mouth daily as needed for nausea or vomiting. 05/16/16   Jene Everyobert Kinner, MD  ondansetron (ZOFRAN-ODT) 4 MG disintegrating tablet Take 1 tablet (4 mg total) by mouth every 8 (eight) hours as needed for nausea or vomiting. 10/06/16   Delorise RoyalsJonathan D Cuthriell, PA-C  oseltamivir (TAMIFLU) 75 MG capsule Take 1 capsule (75 mg total) by mouth 2 (two) times daily. 12/17/16   Lurine Imel L Bosco Paparella, PA-C  oxyCODONE-acetaminophen (ROXICET) 5-325 MG per tablet Take 1 tablet by mouth every 8 (eight) hours as needed for moderate pain or severe pain (Do not drive or operate heavy machinery while taking as can cause drowsiness.). 05/24/15   Renford DillsLindsey Miller, NP  promethazine-dextromethorphan (PROMETHAZINE-DM) 6.25-15 MG/5ML syrup Take 5 mLs by mouth 4 (four) times daily as needed for cough. 12/17/16   Miosha Behe L Joaopedro Eschbach, PA-C    Allergies Penicillins; Sulfa antibiotics; Tramadol; and Vicodin [hydrocodone-acetaminophen]  Family History  Problem Relation Age of Onset  . Cancer Mother   . CAD Other  Social History Social History  Substance Use Topics  . Smoking status: Current Every Day Smoker    Packs/day: 1.00    Types: Cigarettes  . Smokeless tobacco: Never Used  . Alcohol use Yes     Comment: qd     Review of Systems  Constitutional: Positive subjective fevers, chills and fatigue. ENT: Positive nasal congestion, runny nose, left ear pressure, sore throat. Cardiovascular: No chest  pain. Respiratory: Positive productive cough with chest congestion. No shortness of breath or wheezing. Gastrointestinal: Positive posttussive emesis with nausea. No abdominal pain. No diarrhea.   Musculoskeletal: Positive for general myalgias.  Skin: Negative for rash. Neurological: Negative for headaches. 10-point ROS otherwise negative.  ____________________________________________   PHYSICAL EXAM:  VITAL SIGNS: ED Triage Vitals  Enc Vitals Group     BP 12/17/16 1204 111/84     Pulse Rate 12/17/16 1204 89     Resp 12/17/16 1204 16     Temp 12/17/16 1204 98.8 F (37.1 C)     Temp Source 12/17/16 1204 Oral     SpO2 12/17/16 1204 98 %     Weight 12/17/16 1205 135 lb (61.2 kg)     Height 12/17/16 1205 6' (1.829 m)     Head Circumference --      Peak Flow --      Pain Score 12/17/16 1205 5     Pain Loc --      Pain Edu? --      Excl. in GC? --      Constitutional: Alert and oriented. Well appearing and in no acute distress. Eyes: Conjunctivae are normal Without icterus, injection or discharge. Head: Atraumatic. ENT:      Ears: Bilateral TMs visualized with mild serous effusion and trace bulging but no erythema or per duration.      Nose: Mild congestion with clear rhinorrhea. Bilateral turbinates are injected.      Mouth/Throat: Mucous membranes are moist. Pharynx with mild injection but no swelling or exudate. Clear postnasal drainage. Uvula is midline. Airway is patent. Poor dentition throughout but no gumline swelling or tenderness. Neck: No stridor. No carotid bruits. Supple with full range of motion. Hematological/Lymphatic/Immunilogical: No cervical lymphadenopathy. Cardiovascular: Normal rate, regular rhythm. Normal S1 and S2.  Good peripheral circulation. Respiratory: Normal respiratory effort without tachypnea or retractions. Lungs with coarse breath sounds throughout but no wheezes, rhonchi or rales. Breath sounds are heard in all lung fields. Neurologic:  Normal  speech and language. No gross focal neurologic deficits are appreciated.  Skin:  Skin is warm, dry and intact. No rash noted. Psychiatric: Mood and affect are normal. Speech and behavior are normal. Patient exhibits appropriate insight and judgement.   ____________________________________________   LABS  None ____________________________________________  EKG  None ____________________________________________  RADIOLOGY I, Hope Pigeon, personally viewed and evaluated these images (plain radiographs) as part of my medical decision making, as well as reviewing the written report by the radiologist.  Dg Chest 2 View  Result Date: 12/17/2016 CLINICAL DATA:  Productive cough.  Chronic tobacco smoker EXAM: CHEST  2 VIEW COMPARISON:  08/13/2013 FINDINGS: Normal heart size and mediastinal contours. No acute infiltrate or edema. No effusion or pneumothorax. No acute osseous findings. IMPRESSION: Negative chest. Electronically Signed   By: Marnee Spring M.D.   On: 12/17/2016 13:41    ____________________________________________    PROCEDURES  Procedure(s) performed: None   Procedures   Medications  ipratropium-albuterol (DUONEB) 0.5-2.5 (3) MG/3ML nebulizer solution 3 mL (3 mLs Nebulization Given  12/17/16 1425)     ____________________________________________   INITIAL IMPRESSION / ASSESSMENT AND PLAN / ED COURSE  Pertinent labs & imaging results that were available during my care of the patient were reviewed by me and considered in my medical decision making (see chart for details).     Patient's diagnosis is consistent with Influenza. Patient will be discharged home with prescriptions for Tamiflu, Flonase and promethazine DM to take as directed. May take over-the-counter Tylenol or ibuprofen as needed. Patient is to follow up with [facility] if symptoms persist past this treatment course. Patient is given ED precautions to return to the ED for any worsening or  new symptoms.    ____________________________________________  FINAL CLINICAL IMPRESSION(S) / ED DIAGNOSES  Final diagnoses:  Influenza      NEW MEDICATIONS STARTED DURING THIS VISIT:  Discharge Medication List as of 12/17/2016  2:04 PM    START taking these medications   Details  fluticasone (FLONASE) 50 MCG/ACT nasal spray Place 2 sprays into both nostrils daily., Starting Wed 12/17/2016, Print    oseltamivir (TAMIFLU) 75 MG capsule Take 1 capsule (75 mg total) by mouth 2 (two) times daily., Starting Wed 12/17/2016, Print    promethazine-dextromethorphan (PROMETHAZINE-DM) 6.25-15 MG/5ML syrup Take 5 mLs by mouth 4 (four) times daily as needed for cough., Starting Wed 12/17/2016, Print             Ernestene Kiel [place], PA-C 12/17/16 1659    Willy Eddy, MD 12/26/16 1500

## 2017-02-21 ENCOUNTER — Encounter: Payer: Self-pay | Admitting: Emergency Medicine

## 2017-02-21 ENCOUNTER — Emergency Department
Admission: EM | Admit: 2017-02-21 | Discharge: 2017-02-21 | Disposition: A | Discharge status: Home / Self Care | Payer: BLUE CROSS/BLUE SHIELD | Attending: Emergency Medicine | Admitting: Emergency Medicine

## 2017-02-21 DIAGNOSIS — X19XXXA Contact with other heat and hot substances, initial encounter: Secondary | ICD-10-CM | POA: Insufficient documentation

## 2017-02-21 DIAGNOSIS — Y939 Activity, unspecified: Secondary | ICD-10-CM | POA: Insufficient documentation

## 2017-02-21 DIAGNOSIS — T23201A Burn of second degree of right hand, unspecified site, initial encounter: Secondary | ICD-10-CM | POA: Diagnosis not present

## 2017-02-21 DIAGNOSIS — T300 Burn of unspecified body region, unspecified degree: Secondary | ICD-10-CM

## 2017-02-21 DIAGNOSIS — F17210 Nicotine dependence, cigarettes, uncomplicated: Secondary | ICD-10-CM | POA: Insufficient documentation

## 2017-02-21 DIAGNOSIS — Y990 Civilian activity done for income or pay: Secondary | ICD-10-CM | POA: Insufficient documentation

## 2017-02-21 DIAGNOSIS — Y929 Unspecified place or not applicable: Secondary | ICD-10-CM | POA: Diagnosis not present

## 2017-02-21 DIAGNOSIS — T23001A Burn of unspecified degree of right hand, unspecified site, initial encounter: Secondary | ICD-10-CM | POA: Diagnosis present

## 2017-02-21 MED ORDER — SILVER SULFADIAZINE 1 % EX CREA
TOPICAL_CREAM | CUTANEOUS | Status: AC
  Filled 2017-02-21: qty 85

## 2017-02-21 MED ORDER — CEPHALEXIN 500 MG PO CAPS: 500.0000 mg | ORAL_CAPSULE | Freq: Once | ORAL | Status: DC

## 2017-02-21 MED ORDER — CLINDAMYCIN HCL 150 MG PO CAPS
300.0000 mg | ORAL_CAPSULE | Freq: Once | ORAL | Status: AC
  Administered 2017-02-21: 300 mg via ORAL
  Filled 2017-02-21: qty 2

## 2017-02-21 MED ORDER — CLINDAMYCIN HCL 300 MG PO CAPS: 300.0000 mg | ORAL_CAPSULE | Freq: Three times a day (TID) | ORAL | 0 refills | Status: AC

## 2017-02-21 MED ORDER — OXYCODONE-ACETAMINOPHEN 5-325 MG PO TABS: 1.0000 | ORAL_TABLET | Freq: Three times a day (TID) | ORAL | 0 refills | Status: DC | PRN

## 2017-02-21 MED ORDER — SILVER SULFADIAZINE 1 % EX CREA
TOPICAL_CREAM | Freq: Once | CUTANEOUS | Status: AC
  Administered 2017-02-21: 15:00:00 via TOPICAL

## 2017-02-21 MED ORDER — OXYCODONE-ACETAMINOPHEN 5-325 MG PO TABS
1.0000 | ORAL_TABLET | Freq: Once | ORAL | Status: AC
  Administered 2017-02-21: 1 via ORAL
  Filled 2017-02-21: qty 1

## 2017-02-21 NOTE — ED Provider Notes (Signed)
[facility] Emergency Department Provider Note ____________________________________________  Time seen: 1417  I have reviewed the triage vital signs and the nursing notes.  HISTORY  Chief Complaint  Hand Burn  HPI Lee Ruiz is a 36 y.o. male presents to the EDfor evaluation of a burn to the dorsal right hand, 2 days ago. He reports burning the hand at work, after some grease splashed on his hand. He has been managing the burn at home with daily applications of Silvadene cream and wound cleansing with hydrogen peroxide. He presents today noting increased pain, redness, and streaking up the dorsal wrist. He denies any intermittent fevers, chills, sweats. He does note that he had a similar work-related hand burn about 2 years ago. He was treated as an outpatient at the burn center.  Past Medical History:  Diagnosis Date  . Anxiety   . Migraines     There are no active problems to display for this patient.  History reviewed. No pertinent surgical history.  Prior to Admission medications   Medication Sig Start Date End Date Taking? Authorizing Provider  brompheniramine-pseudoephedrine-DM 30-2-10 MG/5ML syrup Take 10 mLs by mouth 4 (four) times daily as needed. 10/06/16   Delorise Royals Cuthriell, PA-C  clindamycin (CLEOCIN) 300 MG capsule Take 1 capsule (300 mg total) by mouth 3 (three) times daily. 02/21/17 03/03/17  Kathrin Folden V Bacon Oletha Tolson, PA-C  fluticasone (FLONASE) 50 MCG/ACT nasal spray Place 2 sprays into both nostrils daily. 12/17/16   Jami L Hagler, PA-C  HYDROcodone-acetaminophen (NORCO/VICODIN) 5-325 MG per tablet Take 2 tablets by mouth every 4 (four) hours as needed. 01/11/15   Teressa Lower, NP  ibuprofen (ADVIL,MOTRIN) 600 MG tablet Take 1 tablet (600 mg total) by mouth every 8 (eight) hours as needed for mild pain or moderate pain. 05/24/15   Renford Dills, NP  ibuprofen (ADVIL,MOTRIN) 800 MG tablet Take 1 tablet (800 mg total) by mouth every 8 (eight)  hours as needed. 11/03/15   Arnaldo Natal, MD  loperamide (IMODIUM A-D) 2 MG tablet Take 1 tablet (2 mg total) by mouth 4 (four) times daily as needed for diarrhea or loose stools. 10/06/16   Delorise Royals Cuthriell, PA-C  ondansetron (ZOFRAN) 4 MG tablet Take 1 tablet (4 mg total) by mouth daily as needed for nausea or vomiting. 05/16/16   Jene Every, MD  ondansetron (ZOFRAN-ODT) 4 MG disintegrating tablet Take 1 tablet (4 mg total) by mouth every 8 (eight) hours as needed for nausea or vomiting. 10/06/16   Delorise Royals Cuthriell, PA-C  oseltamivir (TAMIFLU) 75 MG capsule Take 1 capsule (75 mg total) by mouth 2 (two) times daily. 12/17/16   Jami L Hagler, PA-C  oxyCODONE-acetaminophen (ROXICET) 5-325 MG tablet Take 1 tablet by mouth every 8 (eight) hours as needed. 02/21/17   Rye Decoste V Bacon Wynston Romey, PA-C  promethazine-dextromethorphan (PROMETHAZINE-DM) 6.25-15 MG/5ML syrup Take 5 mLs by mouth 4 (four) times daily as needed for cough. 12/17/16   Jami L Hagler, PA-C    Allergies Penicillins; Sulfa antibiotics; Tramadol; and Vicodin [hydrocodone-acetaminophen]  Family History  Problem Relation Age of Onset  . Cancer Mother   . CAD Other     Social History Social History  Substance Use Topics  . Smoking status: Current Every Day Smoker    Packs/day: 0.50    Types: Cigarettes  . Smokeless tobacco: Never Used  . Alcohol use Yes     Comment: qd    Review of Systems  Constitutional: Negative for fever. Cardiovascular: Negative  for chest pain. Respiratory: Negative for shortness of breath. Gastrointestinal: Negative for abdominal pain, vomiting and diarrhea. Genitourinary: Negative for dysuria. Musculoskeletal: Negative for back pain. Skin: Negative for rash. Right hand burn as above.  Neurological: Negative for headaches, focal weakness or numbness. ____________________________________________  PHYSICAL EXAM:  VITAL SIGNS: ED Triage Vitals  Enc Vitals Group     BP 02/21/17 1404 92/69      Pulse Rate 02/21/17 1404 (!) 101     Resp 02/21/17 1404 20     Temp 02/21/17 1404 98.3 F (36.8 C)     Temp Source 02/21/17 1404 Oral     SpO2 02/21/17 1404 97 %     Weight 02/21/17 1405 140 lb (63.5 kg)     Height 02/21/17 1405 6' (1.829 m)     Head Circumference --      Peak Flow --      Pain Score 02/21/17 1404 9     Pain Loc --      Pain Edu? --      Excl. in GC? --     Constitutional: Alert and oriented. Well appearing and in no distress. Head: Normocephalic and atraumatic. Cardiovascular: Normal rate, regular rhythm. Normal distal pulses. Respiratory: Normal respiratory effort.  Musculoskeletal: Nontender with normal range of motion in all extremities.  Neurologic:  Normal gait without ataxia. Normal speech and language. No gross focal neurologic deficits are appreciated. Skin:  Skin is warm, dry and intact. No rash noted. 2-cm diameter well-circumscribed 2nd degree burn to the dorsal hand. There is lymphatic streaking proximally from the burn over the wrist.  ____________________________________________  PROCEDURES  Clindamycin 300 mg PO Percocet 5-325 mg PO Wound Care & Dressing Change ____________________________________________  INITIAL IMPRESSION / ASSESSMENT AND PLAN / ED COURSE  Patient with a 2nd degree burn to the dorsal right hand with local infection. He is placed on Clindamycin and Percocet #10 for pain relief. He is returned to work with restrictions tomorrow. He will change dressings daily, using only mild soap & water. He should follow-up with the burn center as needed. A work note for 3 days is provided as requested.  ____________________________________________  FINAL CLINICAL IMPRESSION(S) / ED DIAGNOSES  Final diagnoses:  Thermal burn     Lissa Hoard, PA-C 02/21/17 1516    Myrna Blazer, MD 02/22/17 [phone]

## 2017-02-21 NOTE — ED Triage Notes (Signed)
Burned hand with hot grease 2 days ago, open blister back of hand noted. 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

## 2017-02-21 NOTE — Discharge Instructions (Signed)
Keep the wound & dressing clean, dry, and covered. Take the antibiotic as directed until all pills are gone. Clean the wound with mild soap (Dove) and water, ONLY. Take Ibuprofen along with the pain medicine for pain relief. Follow-up with the Burn Center as before.

## 2017-02-21 NOTE — ED Notes (Signed)
NAD noted at time of D/C. Pt denies questions or concerns. Pt ambulatory to the lobby at this time.  

## 2017-05-29 ENCOUNTER — Encounter: Payer: Self-pay | Admitting: *Deleted

## 2017-05-29 ENCOUNTER — Emergency Department
Admission: EM | Admit: 2017-05-29 | Discharge: 2017-05-29 | Disposition: A | Discharge status: Home / Self Care | Payer: BLUE CROSS/BLUE SHIELD | Attending: Emergency Medicine | Admitting: Emergency Medicine

## 2017-05-29 DIAGNOSIS — F17210 Nicotine dependence, cigarettes, uncomplicated: Secondary | ICD-10-CM | POA: Diagnosis not present

## 2017-05-29 DIAGNOSIS — K0889 Other specified disorders of teeth and supporting structures: Secondary | ICD-10-CM | POA: Diagnosis present

## 2017-05-29 DIAGNOSIS — K029 Dental caries, unspecified: Secondary | ICD-10-CM | POA: Diagnosis not present

## 2017-05-29 DIAGNOSIS — K0510 Chronic gingivitis, plaque induced: Secondary | ICD-10-CM

## 2017-05-29 DIAGNOSIS — K0511 Chronic gingivitis, non-plaque induced: Secondary | ICD-10-CM | POA: Diagnosis not present

## 2017-05-29 MED ORDER — OXYCODONE-ACETAMINOPHEN 5-325 MG PO TABS
1.0000 | ORAL_TABLET | Freq: Once | ORAL | Status: AC
  Administered 2017-05-29: 1 via ORAL
  Filled 2017-05-29: qty 1

## 2017-05-29 MED ORDER — CLINDAMYCIN PHOSPHATE 600 MG/4ML IJ SOLN
600.0000 mg | Freq: Once | INTRAMUSCULAR | Status: AC
  Administered 2017-05-29: 600 mg via INTRAMUSCULAR
  Filled 2017-05-29: qty 4

## 2017-05-29 MED ORDER — CLINDAMYCIN HCL 150 MG PO CAPS: 300.0000 mg | ORAL_CAPSULE | Freq: Three times a day (TID) | ORAL | 0 refills | Status: AC

## 2017-05-29 MED ORDER — IBUPROFEN 600 MG PO TABS: 600.0000 mg | ORAL_TABLET | Freq: Three times a day (TID) | ORAL | 0 refills | Status: DC | PRN

## 2017-05-29 NOTE — ED Triage Notes (Signed)
Pt states gum disease and broken teeth that are causing dental and facial pain, awake and alert in no acute distress

## 2017-05-29 NOTE — Discharge Instructions (Signed)
Discontinue smoking. This is increasing your dental pain. Begin taking clindamycin 2 capsules 3 times a day for the next 7 days. Ibuprofen for inflammation and pain. Contact one of the dental clinics listed on your discharge papers. Ask if they except any type of dental insurance. You must be seen by a dentist. This condition will only continued to get worse and affect other organs in your body.  OPTIONS FOR DENTAL FOLLOW UP CARE  [place] Department of Health and Human Services - Local Safety Net Dental Clinics TripDoors.comhttp://www.ncdhhs.gov/dph/oralhealth/services/safetynetclinics.htm   [facility] [phone])  [facility] [phone])  [facility] [phone])  [facility] [phone])  [facility] [phone]) This clinic caters to the indigent population and is on a lottery system. Location: [employer] [address] [address], [address], [place] [facility] Hours: Wednesdays from 6pm - 9pm, patients seen by a lottery system. For dates, call or go to [email or web address] Services: Cleanings, fillings and simple extractions. Payment Options: DENTAL WORK IS FREE OF CHARGE. Bring proof of income or support. Best way to get seen: Arrive at 5:15 pm - this is a lottery, NOT first come/first serve, so arriving earlier will not increase your chances of being seen.     [facility] [phone] Select option 1 for emergencies   Location: [facility] [place], [address], [place] [facility] Hours: No walk-ins accepted - call the day before to schedule an appointment. Check in times are 9:30 am and 1:30 pm. Services: Simple extractions, temporary fillings, pulpectomy/pulp debridement, uncomplicated abscess drainage. Payment Options: PAYMENT IS DUE AT THE TIME OF SERVICE.  Fee is usually $100-200, additional surgical procedures (e.g. abscess  drainage) may be extra. Cash, checks, Visa/MasterCard accepted.  Can file Medicaid if patient is covered for dental - patient should call case worker to check. No discount for [facility] patients. Best way to get seen: MUST call the day before and get onto the schedule. Can usually be seen the next 1-2 days. No walk-ins accepted.     [facility] [phone]   Location: [facility], [address], [place] [facility] Hours: M, W, Th, F 8am or 1:30pm, Tues 9a or 1:30 - first come/first served. Services: Simple extractions, temporary fillings, uncomplicated abscess drainage.  You do not need to be an [place] resident. Payment Options: PAYMENT IS DUE AT THE TIME OF SERVICE. Dental insurance, otherwise sliding scale - bring proof of income or support. Depending on income and treatment needed, cost is usually $50-200. Best way to get seen: Arrive early as it is first come/first served.     [facility] [phone]   Location: [address] Clinic Hours: Mon-Thu 8a-5p Services: Most basic dental services including extractions and fillings. Payment Options: PAYMENT IS DUE AT THE TIME OF SERVICE. Sliding scale, up to 50% off - bring proof if income or support. Medicaid with dental option accepted. Best way to get seen: Call to schedule an appointment, can usually be seen within 2 weeks OR they will try to see walk-ins - show up at 8a or 2p (you may have to wait).     [facility] [phone] [place] RESIDENTS ONLY   Location: [facility], [address], [place], [place] [id number] Clinic Hours: By appointment only. Monday - Thursday 8am-5pm, Friday 8am-12pm Services: Cleanings, fillings, extractions. Payment Options: PAYMENT IS DUE AT THE TIME  OF SERVICE. Cash, Visa or MasterCard. Sliding scale - $30 minimum per service. Best way to get  seen: Come in to office, complete packet and make an appointment - need proof of income or support monies for each household member and proof of [facility] [place] residence. Usually takes about a month to get in.     [facility] [phone]   Location: [address], [facility] Clinic Hours: Walk-in Urgent Care Dental Services are offered Monday-Friday mornings only. The numbers of emergencies accepted daily is limited to the number of providers available. Maximum 15 - Mondays, Wednesdays & Thursdays Maximum 10 - Tuesdays & Fridays Services: You do not need to be a [facility] resident to be seen for a dental emergency. Emergencies are defined as pain, swelling, abnormal bleeding, or dental trauma. Walkins will receive x-rays if needed. NOTE: Dental cleaning is not an emergency. Payment Options: PAYMENT IS DUE AT THE TIME OF SERVICE. Minimum co-pay is $40.00 for uninsured patients. Minimum co-pay is $3.00 for Medicaid with dental coverage. Dental Insurance is accepted and must be presented at time of visit. Medicare does not cover dental. Forms of payment: Cash, credit card, checks. Best way to get seen: If not previously registered with the clinic, walk-in dental registration begins at 7:15 am and is on a first come/first serve basis. If previously registered with the clinic, call to make an appointment.     [facility] [phone] [place] RESIDENTS ONLY   Location: [address], [place], [place] Clinic Hours: Mon-Thu 10a-2p Services: Extractions only! Payment Options: FREE (donations accepted) - bring proof of income or support Best way to get seen: Call and schedule an appointment OR come at 8am on the 1st Monday of every month (except for holidays) when it is first come/first served.     [facility] [phone]   Location: [address] [place], [place] Clinic Hours: Friday mornings Services, Payment Options, Best  way to get seen: Call for info

## 2017-05-29 NOTE — ED Provider Notes (Signed)
[facility] Emergency Department Provider Note  ____________________________________________   First MD Initiated Contact with Patient 05/29/17 0830     (approximate)  I have reviewed the triage vital signs and the nursing notes.   HISTORY  Chief Complaint Dental Pain    HPI Lee Ruiz is a 36 y.o. male is here complaining of dental pain. Patient states he is aware that he has "gum disease and broken teeth". He states that he is not currently seeing a dentist but noticed that it will take $8000 to do his dental work. He states he has dental insurance that "doesn't pay good". He rates his pain as a 9 out of 10.   Past Medical History:  Diagnosis Date  . Anxiety   . Migraines     There are no active problems to display for this patient.   History reviewed. No pertinent surgical history.  Prior to Admission medications   Medication Sig Start Date End Date Taking? Authorizing Provider  clindamycin (CLEOCIN) 150 MG capsule Take 2 capsules (300 mg total) by mouth 3 (three) times daily. 05/29/17 06/05/17  Tommi RumpsSummers, Mazikeen Hehn L, PA-C  fluticasone (FLONASE) 50 MCG/ACT nasal spray Place 2 sprays into both nostrils daily. 12/17/16   Hagler, Jami L, PA-C  ibuprofen (ADVIL,MOTRIN) 600 MG tablet Take 1 tablet (600 mg total) by mouth every 8 (eight) hours as needed. 05/29/17   Tommi RumpsSummers, Greer Wainright L, PA-C  loperamide (IMODIUM A-D) 2 MG tablet Take 1 tablet (2 mg total) by mouth 4 (four) times daily as needed for diarrhea or loose stools. 10/06/16   Cuthriell, Delorise RoyalsJonathan D, PA-C    Allergies Penicillins; Sulfa antibiotics; Tramadol; and Vicodin [hydrocodone-acetaminophen]  Family History  Problem Relation Age of Onset  . Cancer Mother   . CAD Other     Social History Social History  Substance Use Topics  . Smoking status: Current Every Day Smoker    Packs/day: 0.50    Types: Cigarettes  . Smokeless tobacco: Never Used  . Alcohol use Yes     Comment: qd     Review of Systems Constitutional: No fever/chills Eyes: No visual changes. ENT: Positive dental pain. Cardiovascular: Denies chest pain. Respiratory: Denies shortness of breath. Neurological: History of migraine Psychiatric:History of anxiety. ____________________________________________   PHYSICAL EXAM:  VITAL SIGNS: ED Triage Vitals  Enc Vitals Group     BP 05/29/17 0820 126/82     Pulse Rate 05/29/17 0820 99     Resp 05/29/17 0820 16     Temp 05/29/17 0820 99.1 F (37.3 C)     Temp Source 05/29/17 0820 Oral     SpO2 05/29/17 0820 99 %     Weight 05/29/17 0819 140 lb (63.5 kg)     Height 05/29/17 0819 6' (1.829 m)     Head Circumference --      Peak Flow --      Pain Score 05/29/17 0819 9     Pain Loc --      Pain Edu? --      Excl. in GC? --     Constitutional: Alert and oriented. Well appearing and in no acute distress. Eyes: Conjunctivae are normal. PERRL. EOMI. Head: Atraumatic. Nose: No congestion/rhinnorhea. Mouth/Throat: Mucous membranes are moist.  Oropharynx non-erythematous. Right upper gums are moderately edematous with no active drainage noted. Teeth are in extremely poor repair and hygiene. Neck: No stridor.   Hematological/Lymphatic/Immunilogical: Minimal bilateral cervical lymphadenopathy. Respiratory: Normal respiratory effort.   Skin:  Skin is  warm, dry and intact. No rash noted. Psychiatric: Mood and affect are normal. Speech and behavior are normal.  ____________________________________________   LABS (all labs ordered are listed, but only abnormal results are displayed)  Labs Reviewed - No data to display ____________________________________________  PROCEDURES  Procedure(s) performed: None  Procedures  Critical Care performed: No  ____________________________________________   INITIAL IMPRESSION / ASSESSMENT AND PLAN / ED COURSE  Pertinent labs & imaging results that were available during my care of the patient were  reviewed by me and considered in my medical decision making (see chart for details).  Patient has been seen in the emergency room prior to today for same complaints. He states he is "saving up for his teeth". Patient was given clindamycin 600 mg IM in the department today along with one Percocet. He is given a prescription for and to new use of clindamycin and a prescription for ibuprofen 60 mg 3 times a day with food. He was given a list of dental clinics that he should call to see if they will accept seeing him since he does have some form of dental insurance. He is aware that he will need to see a dentist.      ____________________________________________   FINAL CLINICAL IMPRESSION(S) / ED DIAGNOSES  Final diagnoses:  Pain due to dental caries  Gingivitis, chronic      NEW MEDICATIONS STARTED DURING THIS VISIT:  Discharge Medication List as of 05/29/2017  9:19 AM    START taking these medications   Details  clindamycin (CLEOCIN) 150 MG capsule Take 2 capsules (300 mg total) by mouth 3 (three) times daily., Starting Fri 05/29/2017, Until Fri 06/05/2017, Print         Note:  This document was prepared using Dragon voice recognition software and may include unintentional dictation errors.    Tommi Rumps, PA-C 05/29/17 1031    Sharyn Creamer, MD 05/29/17 [phone]

## 2017-08-15 ENCOUNTER — Emergency Department
Admission: EM | Admit: 2017-08-15 | Discharge: 2017-08-15 | Disposition: A | Discharge status: Home / Self Care | Payer: BLUE CROSS/BLUE SHIELD | Attending: Student in an Organized Health Care Education/Training Program | Admitting: Student in an Organized Health Care Education/Training Program

## 2017-08-15 ENCOUNTER — Encounter: Payer: Self-pay | Admitting: Emergency Medicine

## 2017-08-15 DIAGNOSIS — K0889 Other specified disorders of teeth and supporting structures: Secondary | ICD-10-CM | POA: Diagnosis present

## 2017-08-15 DIAGNOSIS — F17210 Nicotine dependence, cigarettes, uncomplicated: Secondary | ICD-10-CM | POA: Insufficient documentation

## 2017-08-15 DIAGNOSIS — K029 Dental caries, unspecified: Secondary | ICD-10-CM | POA: Diagnosis not present

## 2017-08-15 MED ORDER — CLINDAMYCIN HCL 300 MG PO CAPS: 300.0000 mg | ORAL_CAPSULE | Freq: Three times a day (TID) | ORAL | 0 refills | Status: AC

## 2017-08-15 MED ORDER — LIDOCAINE VISCOUS 2 % MT SOLN
15.0000 mL | Freq: Once | OROMUCOSAL | Status: AC
  Administered 2017-08-15: 15 mL via OROMUCOSAL
  Filled 2017-08-15: qty 15

## 2017-08-15 MED ORDER — LIDOCAINE VISCOUS 2 % MT SOLN: 10.0000 mL | OROMUCOSAL | 0 refills | Status: DC | PRN

## 2017-08-15 MED ORDER — OXYCODONE-ACETAMINOPHEN 5-325 MG PO TABS: 1.0000 | ORAL_TABLET | Freq: Four times a day (QID) | ORAL | 0 refills | Status: DC | PRN

## 2017-08-15 MED ORDER — ACETAMINOPHEN-CODEINE #3 300-30 MG PO TABS: 1.0000 | ORAL_TABLET | Freq: Four times a day (QID) | ORAL | 0 refills | Status: DC | PRN

## 2017-08-15 NOTE — ED Triage Notes (Signed)
L lower dental pain x 2 days.  

## 2017-08-15 NOTE — ED Provider Notes (Signed)
[facility] Emergency Department Provider Note  ____________________________________________  Time seen: Approximately 1:44 PM  I have reviewed the triage vital signs and the nursing notes.   HISTORY  Chief Complaint Dental Pain    HPI Lee Ruiz is a 36 y.o. male that presents to the emergency department for evaluation of left lower dental pain for 2 days. He brought his x-ray with him and knows that he has multiple cavities. Patient states that he's been dealing with dental pain for 6 years. He saw an oral surgeon last year but needs $8000 to have the surgery. He needs about 500 more dollars to have the surgery, which is two pay checks.He denies fever, drainage from mouth, shortness breath, nausea, vomiting.   Past Medical History:  Diagnosis Date  . Anxiety   . Migraines     There are no active problems to display for this patient.   History reviewed. No pertinent surgical history.  Prior to Admission medications   Medication Sig Start Date End Date Taking? Authorizing Provider  acetaminophen-codeine (TYLENOL #3) 300-30 MG tablet Take 1 tablet by mouth every 6 (six) hours as needed for moderate pain. 08/15/17   Enid Derry, PA-C  clindamycin (CLEOCIN) 300 MG capsule Take 1 capsule (300 mg total) by mouth 3 (three) times daily. 08/15/17 08/25/17  Enid Derry, PA-C  fluticasone (FLONASE) 50 MCG/ACT nasal spray Place 2 sprays into both nostrils daily. 12/17/16   Hagler, Jami L, PA-C  ibuprofen (ADVIL,MOTRIN) 600 MG tablet Take 1 tablet (600 mg total) by mouth every 8 (eight) hours as needed. 05/29/17   Tommi Rumps, PA-C  lidocaine (XYLOCAINE) 2 % solution Use as directed 10 mLs in the mouth or throat as needed for mouth pain. 08/15/17   Enid Derry, PA-C  loperamide (IMODIUM A-D) 2 MG tablet Take 1 tablet (2 mg total) by mouth 4 (four) times daily as needed for diarrhea or loose stools. 10/06/16   Cuthriell, Delorise Royals, PA-C     Allergies Penicillins; Sulfa antibiotics; Tramadol; and Vicodin [hydrocodone-acetaminophen]  Family History  Problem Relation Age of Onset  . Cancer Mother   . CAD Other     Social History Social History  Substance Use Topics  . Smoking status: Current Every Day Smoker    Packs/day: 0.50    Types: Cigarettes  . Smokeless tobacco: Never Used  . Alcohol use Yes     Comment: qd     Review of Systems  Constitutional: No fever/chills Cardiovascular: No chest pain. Respiratory: No SOB. Gastrointestinal: No abdominal pain.  No nausea, no vomiting.  Musculoskeletal: Negative for musculoskeletal pain. Skin: Negative for rash, abrasions, lacerations, ecchymosis. Neurological: Negative for headaches, numbness or tingling   ____________________________________________   PHYSICAL EXAM:  VITAL SIGNS: ED Triage Vitals [08/15/17 1225]  Enc Vitals Group     BP (!) 118/102     Pulse Rate 95     Resp 18     Temp 98.8 F (37.1 C)     Temp Source Oral     SpO2 97 %     Weight 135 lb (61.2 kg)     Height 6' (1.829 m)     Head Circumference      Peak Flow      Pain Score 10     Pain Loc      Pain Edu?      Excl. in GC?      Constitutional: Alert and oriented. He has full Eyes: Conjunctivae are normal.  PERRL. EOMI. Head: Atraumatic. ENT:      Ears:      Nose: No congestion/rhinnorhea.      Mouth/Throat: Mucous membranes are moist. Very poor dentition with multiple cavities and broken teeth. No  drainage. No visible abscess. No difficulty opening and closing mouth. Neck: No stridor.  Cardiovascular: Normal rate, regular rhythm.  Good peripheral circulation. Respiratory: Normal respiratory effort without tachypnea or retractions. Lungs CTAB. Good air entry to the bases with no decreased or absent breath sounds. Musculoskeletal: Full range of motion to all extremities. No gross deformities appreciated. Neurologic:  Normal speech and language. No gross focal neurologic  deficits are appreciated.  Skin:  Skin is warm, dry and intact. No rash noted.   ____________________________________________   LABS (all labs ordered are listed, but only abnormal results are displayed)  Labs Reviewed - No data to display ____________________________________________  EKG   ____________________________________________  RADIOLOGY   No results found.  ____________________________________________    PROCEDURES  Procedure(s) performed:    Procedures    Medications  lidocaine (XYLOCAINE) 2 % viscous mouth solution 15 mL (15 mLs Mouth/Throat Given 08/15/17 1405)     ____________________________________________   INITIAL IMPRESSION / ASSESSMENT AND PLAN / ED COURSE  Pertinent labs & imaging results that were available during my care of the patient were reviewed by me and considered in my medical decision making (see chart for details).  Review of the [place] CSRS was performed in accordance of the [facility] prior to dispensing any controlled drugs.   Patient's diagnosis is consistent with dental caries. Vital signs and exam are reassuring. Patient has an oral surgeon that he'll call on Monday. Patient will be discharged home with prescriptions for clindamycin, viscous lidocaine, and a short course of Tylenol 3. Patient is to follow up with PCP as directed. Patient is given ED precautions to return to the ED for any worsening or new symptoms.     ____________________________________________  FINAL CLINICAL IMPRESSION(S) / ED DIAGNOSES  Final diagnoses:  Dental caries      NEW MEDICATIONS STARTED DURING THIS VISIT:  Discharge Medication List as of 08/15/2017  1:51 PM    START taking these medications   Details  clindamycin (CLEOCIN) 300 MG capsule Take 1 capsule (300 mg total) by mouth 3 (three) times daily., Starting Sat 08/15/2017, Until Tue 08/25/2017, Print    lidocaine (XYLOCAINE) 2 % solution Use as directed 10 mLs in the mouth or throat as  needed for mouth pain., Starting Sat 08/15/2017, Print    oxyCODONE-acetaminophen (ROXICET) 5-325 MG tablet Take 1 tablet by mouth every 6 (six) hours as needed., Starting Sat 08/15/2017, Until Sun 08/15/2018, Print            This chart was dictated using voice recognition software/Dragon. Despite best efforts to proofread, errors can occur which can change the meaning. Any change was purely unintentional.    Enid Derry, PA-C 08/15/17 1441    Willy Eddy, MD 08/15/17 [phone]

## 2018-01-16 ENCOUNTER — Emergency Department
Admission: EM | Admit: 2018-01-16 | Discharge: 2018-01-16 | Disposition: A | Discharge status: Home / Self Care | Payer: BLUE CROSS/BLUE SHIELD | Attending: Emergency Medicine | Admitting: Emergency Medicine

## 2018-01-16 ENCOUNTER — Other Ambulatory Visit: Payer: Self-pay

## 2018-01-16 ENCOUNTER — Encounter: Payer: Self-pay | Admitting: Emergency Medicine

## 2018-01-16 ENCOUNTER — Emergency Department: Payer: BLUE CROSS/BLUE SHIELD

## 2018-01-16 DIAGNOSIS — Z79899 Other long term (current) drug therapy: Secondary | ICD-10-CM | POA: Diagnosis not present

## 2018-01-16 DIAGNOSIS — M25511 Pain in right shoulder: Secondary | ICD-10-CM | POA: Diagnosis not present

## 2018-01-16 DIAGNOSIS — F17210 Nicotine dependence, cigarettes, uncomplicated: Secondary | ICD-10-CM | POA: Diagnosis not present

## 2018-01-16 MED ORDER — MELOXICAM 15 MG PO TABS: 15.0000 mg | ORAL_TABLET | Freq: Every day | ORAL | 0 refills | Status: DC

## 2018-01-16 MED ORDER — CYCLOBENZAPRINE HCL 10 MG PO TABS: 10.0000 mg | ORAL_TABLET | Freq: Three times a day (TID) | ORAL | 0 refills | Status: DC | PRN

## 2018-01-16 NOTE — ED Notes (Signed)
See triage note  Presents with pain to right shoulder for the past 2 weeks  Pain is to shoulder  Denies any fall or initial injury  Area is tender on palpation and increases with movement

## 2018-01-16 NOTE — ED Triage Notes (Signed)
R shoulder pain x 2 weeks. Denies specific injury at onset.

## 2018-01-16 NOTE — ED Provider Notes (Signed)
[facility] Emergency Department Provider Note ____________________________________________  Time seen: Approximately 12:11 PM  I have reviewed the triage vital signs and the nursing notes.   HISTORY  Chief Complaint Shoulder Pain    HPI Lee Ruiz is a 37 y.o. male who presents to the emergency department for evaluation and treatment of right shoulder pain that has been present for the past 2 weeks.  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  He states that pain in the shoulder has become constant.  He has had no relief with ibuprofen, Aleve, or Tylenol.  No specific injury. Past Medical History:  Diagnosis Date  . Anxiety   . Migraines     There are no active problems to display for this patient.   History reviewed. No pertinent surgical history.  Prior to Admission medications   Medication Sig Start Date End Date Taking? Authorizing Provider  acetaminophen-codeine (TYLENOL #3) 300-30 MG tablet Take 1 tablet by mouth every 6 (six) hours as needed for moderate pain. 08/15/17   Enid Derry, PA-C  cyclobenzaprine (FLEXERIL) 10 MG tablet Take 1 tablet (10 mg total) by mouth 3 (three) times daily as needed for muscle spasms. 01/16/18   Shantana Christon B, FNP  fluticasone (FLONASE) 50 MCG/ACT nasal spray Place 2 sprays into both nostrils daily. 12/17/16   Hagler, Jami L, PA-C  ibuprofen (ADVIL,MOTRIN) 600 MG tablet Take 1 tablet (600 mg total) by mouth every 8 (eight) hours as needed. 05/29/17   Tommi Rumps, PA-C  lidocaine (XYLOCAINE) 2 % solution Use as directed 10 mLs in the mouth or throat as needed for mouth pain. 08/15/17   Enid Derry, PA-C  loperamide (IMODIUM A-D) 2 MG tablet Take 1 tablet (2 mg total) by mouth 4 (four) times daily as needed for diarrhea or loose stools. 10/06/16   Cuthriell, Delorise Royals, PA-C  meloxicam (MOBIC) 15 MG tablet Take 1 tablet  (15 mg total) by mouth daily. 01/16/18   August Gosser, Rulon Eisenmenger B, FNP    Allergies Penicillins; Sulfa antibiotics; Tramadol; and Vicodin [hydrocodone-acetaminophen]  Family History  Problem Relation Age of Onset  . Cancer Mother   . CAD Other     Social History Social History   Tobacco Use  . Smoking status: Current Every Day Smoker    Packs/day: 1.00    Types: Cigarettes  . Smokeless tobacco: Never Used  Substance Use Topics  . Alcohol use: Yes    Comment: qd  . Drug use: Yes    Types: Marijuana    Review of Systems Constitutional: Negative for fever. Cardiovascular: Negative for chest pain. Respiratory: Negative for shortness of breath. Musculoskeletal: Positive for right shoulder pain Skin: Negative for swelling, rash, lesion, or wound. Neurological: Negative for decrease in sensation  ____________________________________________   PHYSICAL EXAM:  VITAL SIGNS: ED Triage Vitals  Enc Vitals Group     BP 01/16/18 1044 (!) 141/101     Pulse Rate 01/16/18 1044 71     Resp 01/16/18 1044 20     Temp 01/16/18 1044 97.9 F (36.6 C)     Temp Source 01/16/18 1044 Oral     SpO2 01/16/18 1044 99 %     Weight 01/16/18 1046 145 lb (65.8 kg)     Height 01/16/18 1046 6' (1.829 m)     Head Circumference --      Peak Flow --  Pain Score 01/16/18 1046 8     Pain Loc --      Pain Edu? --      Excl. in GC? --     Constitutional: Alert and oriented. Well appearing and in no acute distress. Eyes: Conjunctivae are clear without discharge or drainage Head: Atraumatic Neck: Supple Respiratory: No cough. Respirations are even and unlabored. Musculoskeletal: Pain to palpation over the superior aspect of the right shoulder/AC joint.  Tenderness elicited with palpation over the posterior aspect of the shoulder as well. Neurologic: Awake, alert, oriented x4.  Motor and sensory function is intact. Skin: No rash, lesion, or wound noted over exposed skin surfaces. Psychiatric: Affect  and behavior are appropriate.  ____________________________________________   LABS (all labs ordered are listed, but only abnormal results are displayed)  Labs Reviewed - No data to display ____________________________________________  RADIOLOGY  Image of the lung right shoulder shows some mild degenerative changes in the right [facility] without acute bony abnormality per radiology. ____________________________________________   PROCEDURES  Procedures  ____________________________________________   INITIAL IMPRESSION / ASSESSMENT AND PLAN / ED COURSE  Lee Ruiz is a 37 y.o. who presents to the emergency department for evaluation and treatment of right shoulder pain.  No specific injury.  Symptoms and exam are most consistent with either a rotator cuff strain or are related to the findings of some mild degenerative changes in the [facility].  He will be treated with meloxicam and given a prescription for flexeril. He was advised to return to the ER for symptoms that change or worsen if unable to schedule an appointment.   Medications - No data to display  Pertinent labs & imaging results that were available during my care of the patient were reviewed by me and considered in my medical decision making (see chart for details).  _________________________________________   FINAL CLINICAL IMPRESSION(S) / ED DIAGNOSES  Final diagnoses:  Acute pain of right shoulder    ED Discharge Orders        Ordered    meloxicam (MOBIC) 15 MG tablet  Daily     01/16/18 1249    cyclobenzaprine (FLEXERIL) 10 MG tablet  3 times daily PRN     01/16/18 1249       If controlled substance prescribed during this visit, 12 month history viewed on the [place] prior to issuing an initial prescription for Schedule II or III opiod.    Chinita Pesterriplett, Tavious Griesinger B, FNP 01/16/18 1439    Arnaldo NatalMalinda, Paul F, MD 01/16/18 1505

## 2018-03-17 ENCOUNTER — Other Ambulatory Visit: Payer: Self-pay

## 2018-03-17 ENCOUNTER — Encounter: Payer: Self-pay | Admitting: Emergency Medicine

## 2018-03-17 ENCOUNTER — Emergency Department
Admission: EM | Admit: 2018-03-17 | Discharge: 2018-03-17 | Disposition: A | Discharge status: Home / Self Care | Payer: BLUE CROSS/BLUE SHIELD | Attending: Emergency Medicine | Admitting: Emergency Medicine

## 2018-03-17 DIAGNOSIS — K047 Periapical abscess without sinus: Secondary | ICD-10-CM | POA: Diagnosis not present

## 2018-03-17 DIAGNOSIS — F17210 Nicotine dependence, cigarettes, uncomplicated: Secondary | ICD-10-CM | POA: Insufficient documentation

## 2018-03-17 DIAGNOSIS — K0889 Other specified disorders of teeth and supporting structures: Secondary | ICD-10-CM | POA: Diagnosis present

## 2018-03-17 DIAGNOSIS — Z79899 Other long term (current) drug therapy: Secondary | ICD-10-CM | POA: Diagnosis not present

## 2018-03-17 MED ORDER — CLINDAMYCIN HCL 300 MG PO CAPS: 300.0000 mg | ORAL_CAPSULE | Freq: Four times a day (QID) | ORAL | 0 refills | Status: AC

## 2018-03-17 MED ORDER — CLINDAMYCIN HCL 150 MG PO CAPS
300.0000 mg | ORAL_CAPSULE | Freq: Once | ORAL | Status: AC
  Administered 2018-03-17: 300 mg via ORAL
  Filled 2018-03-17: qty 2

## 2018-03-17 MED ORDER — LIDOCAINE HCL (PF) 1 % IJ SOLN
INTRAMUSCULAR | Status: AC
  Filled 2018-03-17: qty 5

## 2018-03-17 MED ORDER — OXYCODONE-ACETAMINOPHEN 5-325 MG PO TABS
2.0000 | ORAL_TABLET | Freq: Once | ORAL | Status: AC
  Administered 2018-03-17: 2 via ORAL
  Filled 2018-03-17: qty 2

## 2018-03-17 MED ORDER — OXYCODONE-ACETAMINOPHEN 5-325 MG PO TABS: 1.0000 | ORAL_TABLET | Freq: Four times a day (QID) | ORAL | 0 refills | Status: AC | PRN

## 2018-03-17 MED ORDER — LIDOCAINE HCL (PF) 1 % IJ SOLN
5.0000 mL | Freq: Once | INTRAMUSCULAR | Status: AC
  Administered 2018-03-17: 5 mL

## 2018-03-17 NOTE — ED Provider Notes (Signed)
[facility] Emergency Department Provider Note  Time seen: 6:34 AM  I have reviewed the triage vital signs and the nursing notes.   HISTORY  Chief Complaint Dental Pain    HPI Lee Ruiz is a 37 y.o. male with a past medical history of anxiety presents to the emergency department for tooth pain.  According to the patient he has bad teeth and he will intermittently get pain in his teeth but states severe pain in his right front upper teeth over the past 2 to 3 days.  Denies fever.  States nausea but denies vomiting.  Largely negative review of systems otherwise.  Describes his pain as severe, currently tearful during exam.   Past Medical History:  Diagnosis Date  . Anxiety   . Migraines     There are no active problems to display for this patient.   History reviewed. No pertinent surgical history.  Prior to Admission medications   Medication Sig Start Date End Date Taking? Authorizing Provider  acetaminophen-codeine (TYLENOL #3) 300-30 MG tablet Take 1 tablet by mouth every 6 (six) hours as needed for moderate pain. 08/15/17   Enid Derry, PA-C  cyclobenzaprine (FLEXERIL) 10 MG tablet Take 1 tablet (10 mg total) by mouth 3 (three) times daily as needed for muscle spasms. 01/16/18   Triplett, Cari B, FNP  fluticasone (FLONASE) 50 MCG/ACT nasal spray Place 2 sprays into both nostrils daily. 12/17/16   Hagler, Jami L, PA-C  ibuprofen (ADVIL,MOTRIN) 600 MG tablet Take 1 tablet (600 mg total) by mouth every 8 (eight) hours as needed. 05/29/17   Tommi Rumps, PA-C  lidocaine (XYLOCAINE) 2 % solution Use as directed 10 mLs in the mouth or throat as needed for mouth pain. 08/15/17   Enid Derry, PA-C  loperamide (IMODIUM A-D) 2 MG tablet Take 1 tablet (2 mg total) by mouth 4 (four) times daily as needed for diarrhea or loose stools. 10/06/16   Cuthriell, Delorise Royals, PA-C  meloxicam (MOBIC) 15 MG tablet Take 1 tablet (15 mg total) by mouth daily. 01/16/18    Chinita Pester, FNP    Allergies  Allergen Reactions  . Penicillins Anaphylaxis  . Sulfa Antibiotics Shortness Of Breath and Swelling    Not anaphalaxis   . Tramadol   . Vicodin [Hydrocodone-Acetaminophen] Nausea And Vomiting    Family History  Problem Relation Age of Onset  . Cancer Mother   . CAD Other     Social History Social History   Tobacco Use  . Smoking status: Current Every Day Smoker    Packs/day: 1.00    Types: Cigarettes  . Smokeless tobacco: Never Used  Substance Use Topics  . Alcohol use: Yes    Comment: qd  . Drug use: Yes    Types: Marijuana    Review of Systems Constitutional: Negative for fever. ENT: Positive for right frontal dental pain Cardiovascular: Negative for chest pain. Respiratory: Negative for shortness of breath. Gastrointestinal: Negative for abdominal pain, vomiting.  Positive for nausea. Neurological: Negative for headache All other ROS negative  ____________________________________________   PHYSICAL EXAM:  VITAL SIGNS: ED Triage Vitals  Enc Vitals Group     BP 03/17/18 0602 114/81     Pulse Rate 03/17/18 0602 95     Resp 03/17/18 0602 20     Temp 03/17/18 0602 97.8 F (36.6 C)     Temp Source 03/17/18 0602 Oral     SpO2 03/17/18 0602 97 %     Weight  03/17/18 0559 135 lb (61.2 kg)     Height 03/17/18 0559 6' (1.829 m)     Head Circumference --      Peak Flow --      Pain Score 03/17/18 0559 7     Pain Loc --      Pain Edu? --      Excl. in GC? --    Constitutional: Alert and oriented. Well appearing and in no distress. Eyes: Normal exam ENT   Head: Normocephalic and atraumatic.   Mouth/Throat: Mucous membranes are moist.  Patient has significant tenderness to palpation along the right upper central incisor, has swelling at the base of the tooth consistent with likely small abscess.  Overall patient is extremely poor dentition with multiple fractured and decayed teeth to the gumline. Cardiovascular:  Normal rate, regular rhythm. No murmur Respiratory: Normal respiratory effort without tachypnea nor retractions. Breath sounds are clear Gastrointestinal: Soft and nontender. No distention.   Musculoskeletal: Nontender with normal range of motion in all extremities.  Neurologic:  Normal speech and language. No gross focal neurologic deficits  Skin:  Skin is warm, dry and intact.  Psychiatric: Mood and affect are normal.   ____________________________________________   INITIAL IMPRESSION / ASSESSMENT AND PLAN / ED COURSE  Pertinent labs & imaging results that were available during my care of the patient were reviewed by me and considered in my medical decision making (see chart for details).  Patient presents emergency department for dental pain.  Exam consistent with a small dental abscess extremely poor dentition overall.  I used a needle to drain and then perform a small incision in the abscessed area.  Removed approximately 2 cc of pus from the abscess.  Will place on clindamycin, prescribe a short course of pain medication for the patient.  We will provide local dental options for the patient although he states he recently got dental insurance and is going to call dentist today.   INCISION AND DRAINAGE Performed by: Minna Antis Consent: Verbal consent obtained. Risks and benefits: risks, benefits and alternatives were discussed Type: abscess  Body area: right upper central incisor gingiva  Anesthesia: local infiltration  Initially drained, then an incision was made with the cutting surface of an 18ga needle  Local anesthetic: lidocaine 1% w/o epinephrine  Anesthetic total: 2 ml  Complexity: simple  Drainage: purulent  Drainage amount: 2cc  Packing material: none  Patient tolerance: Patient tolerated the procedure well with no immediate complications.     ____________________________________________   FINAL CLINICAL IMPRESSION(S) / ED DIAGNOSES  Dental  abscess    Minna Antis, MD 03/17/18 [phone]

## 2018-03-17 NOTE — ED Triage Notes (Signed)
Patient ambulatory to triage with steady gait, without difficulty or distress noted; pt reports "that he has bad teeth and thinks he has an abscess"

## 2018-03-17 NOTE — Discharge Instructions (Addendum)
Please call dentist today for the next available appointment.  Please see options below for dentist willing to perform extractions at low cost.  He will need to see dentist as soon as possible for further care and intervention.  Return to the emergency department for any fevers or significant increase in swelling.   OPTIONS FOR DENTAL FOLLOW UP CARE  [place] Department of Health and Human Services - Local Safety Net Dental Clinics [email or web address]   [facility] [phone])  Sharl Ma [phone])  [place] [phone])  [facility] [phone])  [facility] [phone]) This clinic caters to the indigent population and is on a lottery system. Location: [employer] [facility], [employer], [address], [place] Clinic Hours: Wednesdays from 6pm - 9pm, patients seen by a lottery system. For dates, call or go to [email or web address] Services: Cleanings, fillings and simple extractions. Payment Options: DENTAL WORK IS FREE OF CHARGE. Bring proof of income or support. Best way to get seen: Arrive at 5:15 pm - this is a lottery, NOT first come/first serve, so arriving earlier will not increase your chances of being seen.     [facility] [phone] Select option 1 for emergencies   Location: [facility], [place], [address], [place] Clinic Hours: No walk-ins accepted - call the day before to schedule an appointment. Check in times are 9:30 am and 1:30 pm. Services: Simple extractions, temporary fillings, pulpectomy/pulp debridement, uncomplicated abscess drainage. Payment Options: PAYMENT IS DUE AT THE TIME OF SERVICE.  Fee is usually $100-200, additional surgical procedures (e.g. abscess drainage) may be extra. Cash, checks, Visa/MasterCard accepted.  Can file  Medicaid if patient is covered for dental - patient should call case worker to check. No discount for [facility] patients. Best way to get seen: MUST call the day before and get onto the schedule. Can usually be seen the next 1-2 days. No walk-ins accepted.     [facility] [phone]   Location: [facility], [address], [place] Clinic Hours: M, W, Th, F 8am or 1:30pm, Tues 9a or 1:30 - first come/first served. Services: Simple extractions, temporary fillings, uncomplicated abscess drainage.  You do not need to be an [facility] resident. Payment Options: PAYMENT IS DUE AT THE TIME OF SERVICE. Dental insurance, otherwise sliding scale - bring proof of income or support. Depending on income and treatment needed, cost is usually $50-200. Best way to get seen: Arrive early as it is first come/first served.     [facility] [phone]   Location: [address] Clinic Hours: Mon-Thu 8a-5p Services: Most basic dental services including extractions and fillings. Payment Options: PAYMENT IS DUE AT THE TIME OF SERVICE. Sliding scale, up to 50% off - bring proof if income or support. Medicaid with dental option accepted. Best way to get seen: Call to schedule an appointment, can usually be seen within 2 weeks OR they will try to see walk-ins - show up at 8a or 2p (you may have to wait).     [facility] [phone] [place] ONLY   Location: [facility], [address], [place], [place] [address] Clinic Hours: By appointment only. Monday - Thursday 8am-5pm, Friday 8am-12pm Services: Cleanings, fillings, extractions. Payment Options: PAYMENT IS DUE AT THE TIME OF SERVICE. Cash, Visa or MasterCard. Sliding scale - $30 minimum per service. Best way  to get seen: Come in to office, complete packet and make an appointment - need proof of  income or support monies for each household member and proof of [facility] residence. Usually takes about a month to get in.     [facility] [phone]   Location: [address], [facility] Clinic Hours: Walk-in Urgent Care Dental Services are offered Monday-Friday mornings only. The numbers of emergencies accepted daily is limited to the number of providers available. Maximum 15 - Mondays, Wednesdays & Thursdays Maximum 10 - Tuesdays & Fridays Services: You do not need to be a [facility] resident to be seen for a dental emergency. Emergencies are defined as pain, swelling, abnormal bleeding, or dental trauma. Walkins will receive x-rays if needed. NOTE: Dental cleaning is not an emergency. Payment Options: PAYMENT IS DUE AT THE TIME OF SERVICE. Minimum co-pay is $40.00 for uninsured patients. Minimum co-pay is $3.00 for Medicaid with dental coverage. Dental Insurance is accepted and must be presented at time of visit. Medicare does not cover dental. Forms of payment: Cash, credit card, checks. Best way to get seen: If not previously registered with the clinic, walk-in dental registration begins at 7:15 am and is on a first come/first serve basis. If previously registered with the clinic, call to make an appointment.     [facility] [phone] [place] RESIDENTS ONLY   Location: [address], [place], [place] Clinic Hours: Mon-Thu 10a-2p Services: Extractions only! Payment Options: FREE (donations accepted) - bring proof of income or support Best way to get seen: Call and schedule an appointment OR come at 8am on the 1st Monday of every month (except for holidays) when it is first come/first served.     [facility] [phone]   Location: [address] [place], [place] Clinic Hours: Friday mornings Services, Payment Options, Best way to get seen: Call for info

## 2018-03-18 ENCOUNTER — Encounter: Payer: Self-pay | Admitting: Emergency Medicine

## 2018-03-18 ENCOUNTER — Emergency Department
Admission: EM | Admit: 2018-03-18 | Discharge: 2018-03-18 | Disposition: A | Discharge status: Home / Self Care | Payer: BLUE CROSS/BLUE SHIELD | Attending: Emergency Medicine | Admitting: Emergency Medicine

## 2018-03-18 ENCOUNTER — Other Ambulatory Visit: Payer: Self-pay

## 2018-03-18 DIAGNOSIS — K047 Periapical abscess without sinus: Secondary | ICD-10-CM

## 2018-03-18 DIAGNOSIS — F17210 Nicotine dependence, cigarettes, uncomplicated: Secondary | ICD-10-CM | POA: Diagnosis not present

## 2018-03-18 DIAGNOSIS — K0889 Other specified disorders of teeth and supporting structures: Secondary | ICD-10-CM | POA: Diagnosis present

## 2018-03-18 DIAGNOSIS — Z79899 Other long term (current) drug therapy: Secondary | ICD-10-CM | POA: Insufficient documentation

## 2018-03-18 LAB — CBC WITH DIFFERENTIAL/PLATELET
BASOS ABS: 0 10*3/uL (ref 0–0.1)
Basophils Relative: 0 %
Eosinophils Absolute: 0.3 10*3/uL (ref 0–0.7)
Eosinophils Relative: 2 %
HEMATOCRIT: 43.6 % (ref 40.0–52.0)
HEMOGLOBIN: 14.9 g/dL (ref 13.0–18.0)
Lymphocytes Relative: 22 %
Lymphs Abs: 2.7 10*3/uL (ref 1.0–3.6)
MCH: 31.4 pg (ref 26.0–34.0)
MCHC: 34.2 g/dL (ref 32.0–36.0)
MCV: 91.8 fL (ref 80.0–100.0)
MONO ABS: 1.1 10*3/uL — AB (ref 0.2–1.0)
Monocytes Relative: 9 %
NEUTROS ABS: 8 10*3/uL — AB (ref 1.4–6.5)
NEUTROS PCT: 67 %
Platelets: 252 10*3/uL (ref 150–440)
RBC: 4.75 MIL/uL (ref 4.40–5.90)
RDW: 13.2 % (ref 11.5–14.5)
WBC: 12.1 10*3/uL — ABNORMAL HIGH (ref 3.8–10.6)

## 2018-03-18 LAB — BASIC METABOLIC PANEL
Anion gap: 7 (ref 5–15)
BUN: 6 mg/dL (ref 6–20)
CALCIUM: 9.1 mg/dL (ref 8.9–10.3)
CO2: 27 mmol/L (ref 22–32)
CREATININE: 0.78 mg/dL (ref 0.61–1.24)
Chloride: 100 mmol/L — ABNORMAL LOW (ref 101–111)
GFR calc Af Amer: >60 mL/min (ref >60)
GLUCOSE: 106 mg/dL — AB (ref 65–99)
Potassium: 4 mmol/L (ref 3.5–5.1)
Sodium: 134 mmol/L — ABNORMAL LOW (ref 135–145)

## 2018-03-18 MED ORDER — METRONIDAZOLE 500 MG PO TABS: 500.0000 mg | ORAL_TABLET | Freq: Three times a day (TID) | ORAL | 0 refills | Status: DC

## 2018-03-18 MED ORDER — METRONIDAZOLE IN NACL 5-0.79 MG/ML-% IV SOLN: 500.0000 mg | Freq: Once | INTRAVENOUS | Status: DC

## 2018-03-18 NOTE — ED Notes (Signed)
Patient says he would rather not have an IV.  PA aware.

## 2018-03-18 NOTE — ED Notes (Signed)
See triage note  States he was seen yesterday and had gum abscess drained   This am states area is filled by up with pus   also having increased pain

## 2018-03-18 NOTE — ED Provider Notes (Signed)
[facility] Emergency Department Provider Note  ____________________________________________   First MD Initiated Contact with Patient 03/18/18 1129     (approximate)  I have reviewed the triage vital signs and the nursing notes.   HISTORY  Chief Complaint Oral Swelling  HPI Lee Ruiz is a 37 y.o. male patient is here with complaint of right sided tooth pain.  He was seen in the emergency department yesterday which time the abscess was drained and he was placed on clindamycin.  Patient states that he did get the prescription filled and has been taking it.  He states that he still continues to have pain.  He states that his dentist is requiring $8000 to repair his teeth.  He has not called any of the clinics listed on his discharge papers from yesterday.   Past Medical History:  Diagnosis Date  . Anxiety   . Migraines     There are no active problems to display for this patient.   History reviewed. No pertinent surgical history.  Prior to Admission medications   Medication Sig Start Date End Date Taking? Authorizing Provider  clindamycin (CLEOCIN) 300 MG capsule Take 1 capsule (300 mg total) by mouth 4 (four) times daily for 10 days. 03/17/18 03/27/18  Minna Antis, MD  loperamide (IMODIUM A-D) 2 MG tablet Take 1 tablet (2 mg total) by mouth 4 (four) times daily as needed for diarrhea or loose stools. 10/06/16   Cuthriell, Delorise Royals, PA-C  meloxicam (MOBIC) 15 MG tablet Take 1 tablet (15 mg total) by mouth daily. 01/16/18   Triplett, Rulon Eisenmenger B, FNP  metroNIDAZOLE (FLAGYL) 500 MG tablet Take 1 tablet (500 mg total) by mouth 3 (three) times daily. 03/18/18   Tommi Rumps, PA-C  oxyCODONE-acetaminophen (PERCOCET) 5-325 MG tablet Take 1 tablet by mouth every 6 (six) hours as needed for severe pain. 03/17/18 03/17/19  Minna Antis, MD    Allergies Penicillins; Sulfa antibiotics; Tramadol; and Vicodin [hydrocodone-acetaminophen]  Family History    Problem Relation Age of Onset  . Cancer Mother   . CAD Other     Social History Social History   Tobacco Use  . Smoking status: Current Every Day Smoker    Packs/day: 1.00    Types: Cigarettes  . Smokeless tobacco: Never Used  Substance Use Topics  . Alcohol use: Yes    Comment: qd  . Drug use: Yes    Types: Marijuana    Review of Systems Constitutional: No fever/chills Eyes: No visual changes. ENT: No sore throat.  Positive for dental pain. Cardiovascular: Denies chest pain. Respiratory: Denies shortness of breath. Gastrointestinal:  No nausea, no vomiting.   Neurological: Negative for headaches, focal weakness or numbness. ____________________________________________   PHYSICAL EXAM:  VITAL SIGNS: ED Triage Vitals [03/18/18 1052]  Enc Vitals Group     BP 117/89     Pulse Rate 67     Resp 16     Temp 98.1 F (36.7 C)     Temp Source Oral     SpO2 100 %     Weight 135 lb (61.2 kg)     Height 6' (1.829 m)     Head Circumference      Peak Flow      Pain Score 10     Pain Loc      Pain Edu?      Excl. in GC?    Constitutional: Alert and oriented. Well appearing and in no acute distress. Eyes: Conjunctivae are  normal.  Head: Atraumatic. Nose: No congestion/rhinnorhea. Mouth/Throat: Poor dental hygiene and repair.  Upper gum is tender and site that was I & D'd  yesterday is erythematous with no drainage at present. Neck: No stridor.   Hematological/Lymphatic/Immunilogical: No cervical lymphadenopathy. Cardiovascular: Normal rate, regular rhythm. Grossly normal heart sounds.  Good peripheral circulation. Respiratory: Normal respiratory effort.  No retractions. Lungs CTAB. Musculoskeletal: His upper and lower extremities without any difficulty. Neurologic:  Normal speech and language. No gross focal neurologic deficits are appreciated. No gait instability. Psychiatric: Mood and affect are normal. Speech and behavior are  normal.  ____________________________________________   LABS (all labs ordered are listed, but only abnormal results are displayed)  Labs Reviewed  CBC WITH DIFFERENTIAL/PLATELET - Abnormal; Notable for the following components:      Result Value   WBC 12.1 (*)    Neutro Abs 8.0 (*)    Monocytes Absolute 1.1 (*)    All other components within normal limits  BASIC METABOLIC PANEL - Abnormal; Notable for the following components:   Sodium 134 (*)    Chloride 100 (*)    Glucose, Bld 106 (*)    All other components within normal limits    PROCEDURES  Procedure(s) performed: None  Procedures  Critical Care performed: No  ____________________________________________   INITIAL IMPRESSION / ASSESSMENT AND PLAN / ED COURSE  As part of my medical decision making, I reviewed the following data within the electronic MEDICAL RECORD NUMBER Notes from prior ED visits and [place] Controlled Substance Database  Patient is here with dental abscess.  Currently he is taking clindamycin.  He was given a list of clinics including the walk-in clinics to follow-up with.  Patient was offered Flagyl IV but because of his fear of needles was given a prescription for Flagyl 500 mg 3 times daily for 7 days.  He is to continue with the clindamycin and given a note to remain out of work for next 2 days.  A list of numbers for the walk-in clinics was given to him today.  ____________________________________________   FINAL CLINICAL IMPRESSION(S) / ED DIAGNOSES  Final diagnoses:  Dental abscess     ED Discharge Orders        Ordered    metroNIDAZOLE (FLAGYL) 500 MG tablet  3 times daily     03/18/18 1217       Note:  This document was prepared using Dragon voice recognition software and may include unintentional dictation errors.    Tommi Rumps, PA-C 03/18/18 1544    Nita Sickle, MD 03/19/18 0730

## 2018-03-18 NOTE — Discharge Instructions (Signed)
Continue taking clindamycin as prescribed and begin taking Flagyl 500 mg 3 times daily with food.  Do not drink alcohol with this medication as it can cause severe vomiting.  Call and make an appointment with the dentist or look at the dental clinics listed below.  OPTIONS FOR DENTAL FOLLOW UP CARE  [place] Department of Health and Human Services - Local Safety Net Dental Clinics [email or web address]   [facility] [phone])  Lee Ruiz [phone])  [place] [phone])  [facility] [phone])  [facility] [phone]) This clinic caters to the indigent population and is on a lottery system. Location: [employer] [facility], [employer], [address], [place] Clinic Hours: Wednesdays from 6pm - 9pm, patients seen by a lottery system. For dates, call or go to [email or web address] Services: Cleanings, fillings and simple extractions. Payment Options: DENTAL WORK IS FREE OF CHARGE. Bring proof of income or support. Best way to get seen: Arrive at 5:15 pm - this is a lottery, NOT first come/first serve, so arriving earlier will not increase your chances of being seen.     [facility] [phone] Select option 1 for emergencies   Location: [facility], [place], [address], [place] Clinic Hours: No walk-ins accepted - call the day before to schedule an appointment. Check in times are 9:30 am and 1:30 pm. Services: Simple extractions, temporary fillings, pulpectomy/pulp debridement, uncomplicated abscess drainage. Payment Options: PAYMENT IS DUE AT THE TIME OF SERVICE.  Fee is usually $100-200, additional surgical procedures (e.g. abscess drainage) may be extra. Cash, checks, Visa/MasterCard accepted.  Can file Medicaid if patient is covered for dental - patient should  call case worker to check. No discount for [facility] patients. Best way to get seen: MUST call the day before and get onto the schedule. Can usually be seen the next 1-2 days. No walk-ins accepted.     [facility] [phone]   Location: [facility], [address], [place] Clinic Hours: M, W, Th, F 8am or 1:30pm, Tues 9a or 1:30 - first come/first served. Services: Simple extractions, temporary fillings, uncomplicated abscess drainage.  You do not need to be an [facility] resident. Payment Options: PAYMENT IS DUE AT THE TIME OF SERVICE. Dental insurance, otherwise sliding scale - bring proof of income or support. Depending on income and treatment needed, cost is usually $50-200. Best way to get seen: Arrive early as it is first come/first served.     [facility] [phone]   Location: [address] Clinic Hours: Mon-Thu 8a-5p Services: Most basic dental services including extractions and fillings. Payment Options: PAYMENT IS DUE AT THE TIME OF SERVICE. Sliding scale, up to 50% off - bring proof if income or support. Medicaid with dental option accepted. Best way to get seen: Call to schedule an appointment, can usually be seen within 2 weeks OR they will try to see walk-ins - show up at 8a or 2p (you may have to wait).     [facility] [phone] [place] ONLY   Location: [facility], [address] [place], [place] [address] Clinic Hours: By appointment only. Monday - Thursday 8am-5pm, Friday 8am-12pm Services: Cleanings, fillings, extractions. Payment Options: PAYMENT IS DUE AT THE TIME OF SERVICE. Cash, Visa or MasterCard. Sliding scale - $30 minimum per service. Best way to get seen: Come in to office,  complete packet and make an appointment - need proof of income or support monies for each household member and  proof of [facility] residence. Usually takes about a month to get in.     [facility] [phone]   Location: [address], [facility] Clinic Hours: Walk-in Urgent Care Dental Services are offered Monday-Friday mornings only. The numbers of emergencies accepted daily is limited to the number of providers available. Maximum 15 - Mondays, Wednesdays & Thursdays Maximum 10 - Tuesdays & Fridays Services: You do not need to be a [facility] resident to be seen for a dental emergency. Emergencies are defined as pain, swelling, abnormal bleeding, or dental trauma. Walkins will receive x-rays if needed. NOTE: Dental cleaning is not an emergency. Payment Options: PAYMENT IS DUE AT THE TIME OF SERVICE. Minimum co-pay is $40.00 for uninsured patients. Minimum co-pay is $3.00 for Medicaid with dental coverage. Dental Insurance is accepted and must be presented at time of visit. Medicare does not cover dental. Forms of payment: Cash, credit card, checks. Best way to get seen: If not previously registered with the clinic, walk-in dental registration begins at 7:15 am and is on a first come/first serve basis. If previously registered with the clinic, call to make an appointment.     [facility] [phone] [place] RESIDENTS ONLY   Location: [address] [place], [place] Clinic Hours: Mon-Thu 10a-2p Services: Extractions only! Payment Options: FREE (donations accepted) - bring proof of income or support Best way to get seen: Call and schedule an appointment OR come at 8am on the 1st Monday of every month (except for holidays) when it is first come/first served.     [facility] [phone]   Location: [address] [place], [place] Clinic Hours: Friday mornings Services, Payment Options, Best way to get seen: Call for info

## 2018-03-18 NOTE — ED Notes (Signed)
First Nurse Note:  Patient was seen yesterday and states swelling is worse.  Will do CBC and BMP.

## 2018-03-18 NOTE — ED Triage Notes (Signed)
Patient here complaining of right sided tooth pain "right under my nose".  States he was seen here yesterday and had tooth drained and was placed on Clindamycin.  Returns today with increased pain.

## 2018-06-18 ENCOUNTER — Emergency Department
Admission: EM | Admit: 2018-06-18 | Discharge: 2018-06-18 | Disposition: A | Discharge status: Home / Self Care | Payer: BLUE CROSS/BLUE SHIELD | Attending: Emergency Medicine | Admitting: Emergency Medicine

## 2018-06-18 ENCOUNTER — Emergency Department: Payer: BLUE CROSS/BLUE SHIELD

## 2018-06-18 ENCOUNTER — Encounter: Payer: Self-pay | Admitting: Emergency Medicine

## 2018-06-18 ENCOUNTER — Other Ambulatory Visit: Payer: Self-pay

## 2018-06-18 DIAGNOSIS — T887XXA Unspecified adverse effect of drug or medicament, initial encounter: Secondary | ICD-10-CM | POA: Diagnosis not present

## 2018-06-18 DIAGNOSIS — R0602 Shortness of breath: Secondary | ICD-10-CM | POA: Insufficient documentation

## 2018-06-18 DIAGNOSIS — F17210 Nicotine dependence, cigarettes, uncomplicated: Secondary | ICD-10-CM | POA: Insufficient documentation

## 2018-06-18 DIAGNOSIS — G8929 Other chronic pain: Secondary | ICD-10-CM | POA: Insufficient documentation

## 2018-06-18 DIAGNOSIS — M549 Dorsalgia, unspecified: Secondary | ICD-10-CM | POA: Diagnosis present

## 2018-06-18 DIAGNOSIS — Z79899 Other long term (current) drug therapy: Secondary | ICD-10-CM | POA: Diagnosis not present

## 2018-06-18 DIAGNOSIS — F1210 Cannabis abuse, uncomplicated: Secondary | ICD-10-CM | POA: Diagnosis not present

## 2018-06-18 DIAGNOSIS — R61 Generalized hyperhidrosis: Secondary | ICD-10-CM | POA: Diagnosis not present

## 2018-06-18 DIAGNOSIS — F419 Anxiety disorder, unspecified: Secondary | ICD-10-CM | POA: Insufficient documentation

## 2018-06-18 DIAGNOSIS — Y69 Unspecified misadventure during surgical and medical care: Secondary | ICD-10-CM | POA: Insufficient documentation

## 2018-06-18 DIAGNOSIS — M546 Pain in thoracic spine: Secondary | ICD-10-CM | POA: Insufficient documentation

## 2018-06-18 DIAGNOSIS — R112 Nausea with vomiting, unspecified: Secondary | ICD-10-CM | POA: Insufficient documentation

## 2018-06-18 LAB — CBC
HCT: 44.2 % (ref 40.0–52.0)
Hemoglobin: 15.4 g/dL (ref 13.0–18.0)
MCH: 31.9 pg (ref 26.0–34.0)
MCHC: 34.7 g/dL (ref 32.0–36.0)
MCV: 91.8 fL (ref 80.0–100.0)
PLATELETS: 251 10*3/uL (ref 150–440)
RBC: 4.82 MIL/uL (ref 4.40–5.90)
RDW: 13.1 % (ref 11.5–14.5)
WBC: 9.7 10*3/uL (ref 3.8–10.6)

## 2018-06-18 LAB — BASIC METABOLIC PANEL
Anion gap: 10 (ref 5–15)
BUN: 8 mg/dL (ref 6–20)
CALCIUM: 8.9 mg/dL (ref 8.9–10.3)
CO2: 23 mmol/L (ref 22–32)
CREATININE: 0.87 mg/dL (ref 0.61–1.24)
Chloride: 106 mmol/L (ref 98–111)
GFR calc Af Amer: >60 mL/min (ref >60)
Glucose, Bld: 109 mg/dL — ABNORMAL HIGH (ref 70–99)
Potassium: 3.6 mmol/L (ref 3.5–5.1)
SODIUM: 139 mmol/L (ref 135–145)

## 2018-06-18 LAB — TROPONIN I

## 2018-06-18 NOTE — ED Notes (Signed)
ED Provider at bedside. 

## 2018-06-18 NOTE — ED Notes (Signed)
Pt ambulatory upon discharge; declined wheel chair. Verbalized understanding of discharge instructions, follow-up care and pain management. VSS. Skin warm and dry. A&O x4. Pt reportedly "feels a whole lot better."

## 2018-06-18 NOTE — ED Notes (Signed)
Pt signed physical copy of discharge - placed in medical records bin by this RN.

## 2018-06-18 NOTE — ED Notes (Addendum)
Pt reports taking "a blue oxy 15mg " today approx 1130 for back pain. Pills are not his rx. States anxious and jittery since and it is "freaking him out." See triage note for remainder of assessment. Reports vomiting x1 earlier today.

## 2018-06-18 NOTE — ED Provider Notes (Signed)
[facility] Emergency Department Provider Note  ____________________________________________  Time seen: Approximately 4:26 PM  I have reviewed the triage vital signs and the nursing notes.   HISTORY  Chief Complaint Anxiety   HPI Lee Ruiz is a 37 y.o. male who presents for evaluation of possible medication side effect and back pain.  Patient reports that he has suffered from back pain for several years.  The back pain has gotten worse over the last month.  He works as a Production designer, theatre/television/filmmanager at [employer] and stands for most of the day which makes the back pain worse.  The pain is dull, located midline in the center of his back, constant, worse with movement and nonradiating.  He denies any trauma, history of IV drug use, night sweats, unintentional weight loss.  He reports that his under a lot of stress at work and has a inspection that is supposed to happen at the [employer] that he works coming up soon.  He decided to then take 15 mg of oxycodone that his cousin gave him.  An hour after taking that patient reports that he started to become extremely anxious, diaphoretic, developed shortness of breath and felt like he was going to pass out.  He reports that he felt nauseated and had several episodes of nonbloody nonbilious emesis.  He does not know if this oxycodone was prescribed to his cousin or a street drug.  He denies ever having problems with opiates in the past.  By the time I spoke with the patient he was already back to his baseline.  Past Medical History:  Diagnosis Date  . Anxiety   . Migraines     Prior to Admission medications   Medication Sig Start Date End Date Taking? Authorizing Provider  loperamide (IMODIUM A-D) 2 MG tablet Take 1 tablet (2 mg total) by mouth 4 (four) times daily as needed for diarrhea or loose stools. 10/06/16   Cuthriell, Delorise RoyalsJonathan D, PA-C  meloxicam (MOBIC) 15 MG tablet Take 1 tablet (15 mg total) by mouth daily. 01/16/18    Triplett, Rulon Eisenmengerari B, FNP  metroNIDAZOLE (FLAGYL) 500 MG tablet Take 1 tablet (500 mg total) by mouth 3 (three) times daily. 03/18/18   Tommi RumpsSummers, Rhonda L, PA-C  oxyCODONE-acetaminophen (PERCOCET) 5-325 MG tablet Take 1 tablet by mouth every 6 (six) hours as needed for severe pain. 03/17/18 03/17/19  Minna AntisPaduchowski, Kevin, MD    Allergies Penicillins; Sulfa antibiotics; Tramadol; and Vicodin [hydrocodone-acetaminophen]  Family History  Problem Relation Age of Onset  . Cancer Mother   . CAD Other     Social History Social History   Tobacco Use  . Smoking status: Current Every Day Smoker    Packs/day: 1.00    Types: Cigarettes  . Smokeless tobacco: Never Used  Substance Use Topics  . Alcohol use: Yes    Comment: qd  . Drug use: Yes    Types: Marijuana    Review of Systems  Constitutional: Negative for fever. Eyes: Negative for visual changes. ENT: Negative for sore throat. Neck: No neck pain  Cardiovascular: Negative for chest pain. Respiratory: Negative for shortness of breath. Gastrointestinal: Negative for abdominal pain, vomiting or diarrhea. Genitourinary: Negative for dysuria. Musculoskeletal: + back pain. Skin: Negative for rash. Neurological: Negative for headaches, weakness or numbness. Psych: No SI or HI. + anxiety  ____________________________________________   PHYSICAL EXAM:  VITAL SIGNS: ED Triage Vitals  Enc Vitals Group     BP 06/18/18 1254 122/87     Pulse  Rate 06/18/18 1254 (!) 101     Resp 06/18/18 1254 18     Temp 06/18/18 1254 98.2 F (36.8 C)     Temp Source 06/18/18 1254 Oral     SpO2 06/18/18 1254 99 %     Weight 06/18/18 1255 140 lb (63.5 kg)     Height 06/18/18 1255 5\' 11"  (1.803 m)     Head Circumference --      Peak Flow --      Pain Score 06/18/18 1255 8     Pain Loc --      Pain Edu? --      Excl. in GC? --     Constitutional: Alert and oriented. Well appearing and in no apparent distress. HEENT:      Head: Normocephalic and  atraumatic.         Eyes: Conjunctivae are normal. Sclera is non-icteric.       Mouth/Throat: Mucous membranes are moist.       Neck: Supple with no signs of meningismus. Cardiovascular: Regular rate and rhythm. No murmurs, gallops, or rubs. 2+ symmetrical distal pulses are present in all extremities. No JVD. Respiratory: Normal respiratory effort. Lungs are clear to auscultation bilaterally. No wheezes, crackles, or rhonchi.  Gastrointestinal: Soft, non tender, and non distended with positive bowel sounds. No rebound or guarding. Musculoskeletal: tender to palpation over the lower thoracic and upper lumbar spine. Nontender with normal range of motion in all extremities. No edema, cyanosis, or erythema of extremities. Neurologic: Normal speech and language. Face is symmetric. Moving all extremities. No gross focal neurologic deficits are appreciated. Skin: Skin is warm, dry and intact. No rash noted. Psychiatric: Mood and affect are normal. Speech and behavior are normal.  ____________________________________________   LABS (all labs ordered are listed, but only abnormal results are displayed)  Labs Reviewed  BASIC METABOLIC PANEL - Abnormal; Notable for the following components:      Result Value   Glucose, Bld 109 (*)    All other components within normal limits  CBC  TROPONIN I   ____________________________________________  EKG  ED ECG REPORT I, Nita Sickle, the attending physician, personally viewed and interpreted this ECG.  Normal sinus rhythm, rate of 93, normal intervals, borderline right axis deviation, no ST elevations or depressions.  Unchanged from prior  ____________________________________________  RADIOLOGY  XR lumbar, thoracic, and CXR with no acute findings ____________________________________________   PROCEDURES  Procedure(s) performed: None Procedures Critical Care performed:  None ____________________________________________   INITIAL  IMPRESSION / ASSESSMENT AND PLAN / ED COURSE  37 y.o. male who presents for evaluation of possible medication side effect and back pain.  # anxiety/ panic attack: after taking 15mg  of street oxy, most likely side effect of the medication. Patient is back to baseline at this time. No SI or HI  # back pain: chronic for over 1 year, unchanged, XR of thoracic and lumbar spine with no acute findings. History overall reassuring with the absence of red flags including:  - Age < 46 and > 51 years old - Rapid progression of symptoms - Traumatic event, even minor trauma - Systemic symptoms including fevers, chills, weight loss - Recent bacterial infection - Unremitting pain or pain at night with rest - Numbness, weakness, difficulty walking, urinary retention or bowel incontinence - Personal history of cancer, immunosuppression, diabetes, known AAA, or renal colic - Personal history of IV drug use.    Exam overall reassuring with symmetric and intact lower extremity strength, sensation, and  reflexes without clonus, 2+ symmetric medial malleolar and dorsalis pedis pulses, no pulsatile abdominal mass.  Based on history and physical, I have a very low suspicion of a concerning etiology of pain including epidural compression syndrome, spinal infection, transverse myelitis, malignancy, abdominal aortic aneurysm, renal colic, acute lower extremity claudication, neurogenic claudication, ankylosing spondylitis, or other intra-abdominal process.      As part of my medical decision making, I reviewed the following data within the electronic MEDICAL RECORD NUMBER Nursing notes reviewed and incorporated, Labs reviewed , EKG interpreted , Old chart reviewed, Radiograph reviewed , Notes from prior ED visits and [place] Controlled Substance Database    Pertinent labs & imaging results that were available during my care of the patient were reviewed by me and considered in my medical decision making (see chart for  details).    ____________________________________________   FINAL CLINICAL IMPRESSION(S) / ED DIAGNOSES  Final diagnoses:  Medication side effect  Chronic midline thoracic back pain      NEW MEDICATIONS STARTED DURING THIS VISIT:  ED Discharge Orders    None       Note:  This document was prepared using Dragon voice recognition software and may include unintentional dictation errors.    Don Perking, [place], MD 06/19/18 1600

## 2018-06-18 NOTE — ED Triage Notes (Signed)
Pt states he has chronic lower back pain, took 15mg  Oxycodone of his cousin's, having anxiety attack that he is going to die, states he feels like he is going to pass out, has vomited "a lot" per pt, stating over and over, "I'm so sorry, I don't want to die today, can I get a note for work?" Paranoid that his job will find out, states he took a half an oxy yesterday and does smoke pot. Appears pale, shaking violently in triage, denies use of cocaine, states "I think i'm having a heart attack."

## 2018-08-21 ENCOUNTER — Encounter: Payer: Self-pay | Admitting: Emergency Medicine

## 2018-08-21 DIAGNOSIS — R0981 Nasal congestion: Secondary | ICD-10-CM | POA: Insufficient documentation

## 2018-08-21 DIAGNOSIS — B349 Viral infection, unspecified: Secondary | ICD-10-CM | POA: Diagnosis not present

## 2018-08-21 DIAGNOSIS — Z79899 Other long term (current) drug therapy: Secondary | ICD-10-CM | POA: Insufficient documentation

## 2018-08-21 DIAGNOSIS — R509 Fever, unspecified: Secondary | ICD-10-CM | POA: Diagnosis present

## 2018-08-21 DIAGNOSIS — F17210 Nicotine dependence, cigarettes, uncomplicated: Secondary | ICD-10-CM | POA: Insufficient documentation

## 2018-08-21 NOTE — ED Triage Notes (Signed)
Patient with complain to sinus congestion and fever times 2 days. Patient states that his temp has been 102 at home.

## 2018-08-22 ENCOUNTER — Emergency Department
Admission: EM | Admit: 2018-08-22 | Discharge: 2018-08-22 | Disposition: A | Discharge status: Home / Self Care | Payer: BLUE CROSS/BLUE SHIELD | Attending: Emergency Medicine | Admitting: Emergency Medicine

## 2018-08-22 DIAGNOSIS — B349 Viral infection, unspecified: Secondary | ICD-10-CM

## 2018-08-22 DIAGNOSIS — R0981 Nasal congestion: Secondary | ICD-10-CM

## 2018-08-22 LAB — INFLUENZA PANEL BY PCR (TYPE A & B)
INFLBPCR: NEGATIVE
Influenza A By PCR: NEGATIVE

## 2018-08-22 MED ORDER — OXYMETAZOLINE HCL 0.05 % NA SOLN
1.0000 | Freq: Once | NASAL | Status: AC
  Administered 2018-08-22: 1 via NASAL
  Filled 2018-08-22: qty 15

## 2018-08-22 MED ORDER — PSEUDOEPHEDRINE HCL 30 MG PO TABS
30.0000 mg | ORAL_TABLET | Freq: Once | ORAL | Status: AC
  Administered 2018-08-22: 30 mg via ORAL
  Filled 2018-08-22: qty 1

## 2018-08-22 MED ORDER — IBUPROFEN 600 MG PO TABS
600.0000 mg | ORAL_TABLET | Freq: Once | ORAL | Status: AC
  Administered 2018-08-22: 600 mg via ORAL
  Filled 2018-08-22: qty 1

## 2018-08-22 MED ORDER — LORATADINE 10 MG PO TABS
10.0000 mg | ORAL_TABLET | Freq: Once | ORAL | Status: AC
  Administered 2018-08-22: 10 mg via ORAL
  Filled 2018-08-22: qty 1

## 2018-08-22 NOTE — Discharge Instructions (Addendum)
It is normal to be sick for a full 4-5 days with this cold.  The most effective medications are over the counter.  I recommend: Tylenol 1000mg  by mouth four times a day Ibuprofen 600mg  by mouth four times a day It is safe to take both of these medicines together One spray of afrin in each nostril twice a day is great to help open you up I highly recommend nasal rinses like a neti pot  If you were prescribed any opioid pain medication today such as Norco, Vicodin, Percocet, morphine, hydrocodone, or oxycodone please make sure you do not drive when you are taking this medication as it can alter your ability to drive safely.  If you have any concerns once you are home that you are not improving or are in fact getting worse before you can make it to your follow-up appointment, please do not hesitate to call 911 and come back for further evaluation.  Merrily Brittle, MD

## 2018-08-22 NOTE — ED Provider Notes (Signed)
[facility] Emergency Department Provider Note  ____________________________________________   First MD Initiated Contact with Patient 08/22/18 0003     (approximate)  I have reviewed the triage vital signs and the nursing notes.   HISTORY  Chief Complaint Nasal Congestion and Fever   HPI Lee Ruiz is a 37 y.o. male who self presents to the emergency department with 2 days of fever to 102 degrees at home, nasal congestion, and left frontal sinus pain.  He took Tylenol shortly before coming to the emergency department tonight.  He is concerned because he works as a Production designer, theatre/television/film in [employer] and is worried that he might be contagious.  He has had some mild dry cough.  Did not get a flu shot this year.  He does have some myalgias and chills while at home.  He is taken no over-the-counter medications aside from Tylenol.  Symptoms came on gradually are intermittent nothing seems to make them better or worse.  He is also concerned about "swelling in my glands" particularly on the left.    Past Medical History:  Diagnosis Date  . Anxiety   . Migraines     There are no active problems to display for this patient.   Past Surgical History:  Procedure Laterality Date  . MOUTH SURGERY      Prior to Admission medications   Medication Sig Start Date End Date Taking? Authorizing Provider  loperamide (IMODIUM A-D) 2 MG tablet Take 1 tablet (2 mg total) by mouth 4 (four) times daily as needed for diarrhea or loose stools. 10/06/16   Cuthriell, Delorise Royals, PA-C  meloxicam (MOBIC) 15 MG tablet Take 1 tablet (15 mg total) by mouth daily. 01/16/18   Triplett, Rulon Eisenmenger B, FNP  metroNIDAZOLE (FLAGYL) 500 MG tablet Take 1 tablet (500 mg total) by mouth 3 (three) times daily. 03/18/18   Tommi Rumps, PA-C  oxyCODONE-acetaminophen (PERCOCET) 5-325 MG tablet Take 1 tablet by mouth every 6 (six) hours as needed for severe pain. 03/17/18 03/17/19  Minna Antis, MD     Allergies Penicillins; Sulfa antibiotics; Tramadol; and Vicodin [hydrocodone-acetaminophen]  Family History  Problem Relation Age of Onset  . Cancer Mother   . CAD Other     Social History Social History   Tobacco Use  . Smoking status: Current Every Day Smoker    Packs/day: 1.00    Types: Cigarettes  . Smokeless tobacco: Never Used  Substance Use Topics  . Alcohol use: Yes  . Drug use: Yes    Types: Marijuana    Review of Systems Constitutional: Positive for fevers and chills Eyes: No visual changes. ENT: Positive for sore throat. Cardiovascular: Denies chest pain. Respiratory: Positive for cough Gastrointestinal: No abdominal pain.  No nausea, no vomiting.  No diarrhea.  No constipation. Genitourinary: Negative for dysuria. Musculoskeletal: Negative for back pain. Skin: Negative for rash. Neurological: Negative for headaches, focal weakness or numbness.   ____________________________________________   PHYSICAL EXAM:  VITAL SIGNS: ED Triage Vitals  Enc Vitals Group     BP 08/21/18 2151 120/88     Pulse Rate 08/21/18 2151 91     Resp 08/21/18 2151 18     Temp 08/21/18 2151 99.1 F (37.3 C)     Temp Source 08/21/18 2151 Oral     SpO2 08/21/18 2151 100 %     Weight 08/21/18 2152 135 lb (61.2 kg)     Height 08/21/18 2152 6' (1.829 m)     Head Circumference --  Peak Flow --      Pain Score 08/21/18 2151 5     Pain Loc --      Pain Edu? --      Excl. in GC? --     Constitutional: Alert and oriented x4 appears somewhat uncomfortable although nontoxic no diaphoresis Eyes: PERRL EOMI. Head: Atraumatic.  Normal tympanic membranes bilaterally.  He is tender over his left frontal sinus Nose:   Positive for congestion Mouth/Throat: No trismus uvula midline no pharyngeal erythema or exudate Neck: No stridor.  Left anterior cervical lymphadenopathy Cardiovascular: Normal rate, regular rhythm. Grossly normal heart sounds.  Good peripheral  circulation. Respiratory: Normal respiratory effort.  No retractions. Lungs CTAB and moving good air Gastrointestinal: Soft nontender Musculoskeletal: No lower extremity edema   Neurologic:  Normal speech and language. No gross focal neurologic deficits are appreciated. Skin:  Skin is warm, dry and intact. No rash noted. Psychiatric: Mood and affect are normal. Speech and behavior are normal.    ____________________________________________   DIFFERENTIAL includes but not limited to  Sinus congestion, sinusitis, otitis media, upper respiratory tract infection, pneumonia, influenza, viral syndrome ____________________________________________   LABS (all labs ordered are listed, but only abnormal results are displayed)  Labs Reviewed  INFLUENZA PANEL BY PCR (TYPE A & B)    Influenza panel is pending __________________________________________  EKG   ____________________________________________  RADIOLOGY   ____________________________________________   PROCEDURES  Procedure(s) performed: no  Procedures  Critical Care performed: no  ____________________________________________   INITIAL IMPRESSION / ASSESSMENT AND PLAN / ED COURSE  Pertinent labs & imaging results that were available during my care of the patient were reviewed by me and considered in my medical decision making (see chart for details).   As part of my medical decision making, I reviewed the following data within the electronic MEDICAL RECORD NUMBER History obtained from family if available, nursing notes, old chart and ekg, as well as notes from prior ED visits.  Patient comes to the emergency department with sinus congestion, nasal congestion, anterior left-sided lymphadenopathy, dry cough, and myalgias for 2 days.  We discussed influenza testing and he would like to be tested but does not want to wait for results which I think is reasonable.  Feels improved after Sudafed and Afrin.  We discussed sinus  rinses and symptomatic treatment.  I will give him 3 days off work as he works in [employer].  Strict return precautions have been given.      ____________________________________________   FINAL CLINICAL IMPRESSION(S) / ED DIAGNOSES  Final diagnoses:  Sinus congestion  Viral syndrome      NEW MEDICATIONS STARTED DURING THIS VISIT:  New Prescriptions   No medications on file     Note:  This document was prepared using Dragon voice recognition software and may include unintentional dictation errors.     Merrily Brittle, MD 08/22/18 [phone]

## 2018-08-25 ENCOUNTER — Emergency Department
Admission: EM | Admit: 2018-08-25 | Discharge: 2018-08-25 | Discharge status: Against Medical Advice | Payer: BLUE CROSS/BLUE SHIELD

## 2018-08-26 ENCOUNTER — Emergency Department
Admission: EM | Admit: 2018-08-26 | Discharge: 2018-08-26 | Disposition: A | Discharge status: Home / Self Care | Payer: BLUE CROSS/BLUE SHIELD | Attending: Emergency Medicine | Admitting: Emergency Medicine

## 2018-08-26 ENCOUNTER — Encounter: Payer: Self-pay | Admitting: Emergency Medicine

## 2018-08-26 DIAGNOSIS — Z79899 Other long term (current) drug therapy: Secondary | ICD-10-CM | POA: Diagnosis not present

## 2018-08-26 DIAGNOSIS — J0100 Acute maxillary sinusitis, unspecified: Secondary | ICD-10-CM | POA: Insufficient documentation

## 2018-08-26 DIAGNOSIS — F17210 Nicotine dependence, cigarettes, uncomplicated: Secondary | ICD-10-CM | POA: Insufficient documentation

## 2018-08-26 DIAGNOSIS — J029 Acute pharyngitis, unspecified: Secondary | ICD-10-CM | POA: Diagnosis present

## 2018-08-26 MED ORDER — METHYLPREDNISOLONE SODIUM SUCC 125 MG IJ SOLR
125.0000 mg | Freq: Once | INTRAMUSCULAR | Status: AC
  Administered 2018-08-26: 125 mg via INTRAMUSCULAR
  Filled 2018-08-26: qty 2

## 2018-08-26 MED ORDER — DOXYCYCLINE HYCLATE 50 MG PO CAPS: 100.0000 mg | ORAL_CAPSULE | Freq: Two times a day (BID) | ORAL | 0 refills | Status: AC

## 2018-08-26 MED ORDER — DOXYCYCLINE HYCLATE 100 MG PO TABS
200.0000 mg | ORAL_TABLET | Freq: Once | ORAL | Status: AC
  Administered 2018-08-26: 200 mg via ORAL
  Filled 2018-08-26: qty 2

## 2018-08-26 NOTE — ED Triage Notes (Signed)
PT arrives with complaints of sore throat, productive cough, runny nose, and body aches that started for the last 5 days. Pt states "I blow my nose and it smells like death, I cannot be running around with food when I have a fever."

## 2018-08-26 NOTE — ED Provider Notes (Signed)
[facility] Emergency Department Provider Note  ____________________________________________  Time seen: Approximately 2:46 PM  I have reviewed the triage vital signs and the nursing notes.   HISTORY  Chief Complaint Sore Throat    HPI Lee Ruiz is a 37 y.o. male that presents to the emergency department for evaluation of nasal congestion, sinus pressure, sore throat, fever for 5 days.  Patient states that nasal congestion has a foul odor.  He had a temperature this morning of 101.  He is allergic to penicillins and sulfa antibiotics.  He smokes a pack of cigarettes per day.  He denies cough, shortness of breath.  Past Medical History:  Diagnosis Date  . Anxiety   . Migraines     There are no active problems to display for this patient.   Past Surgical History:  Procedure Laterality Date  . MOUTH SURGERY      Prior to Admission medications   Medication Sig Start Date End Date Taking? Authorizing Provider  doxycycline (VIBRAMYCIN) 50 MG capsule Take 2 capsules (100 mg total) by mouth 2 (two) times daily for 10 days. 08/26/18 09/05/18  Enid Derry, PA-C  loperamide (IMODIUM A-D) 2 MG tablet Take 1 tablet (2 mg total) by mouth 4 (four) times daily as needed for diarrhea or loose stools. 10/06/16   Cuthriell, Delorise Royals, PA-C  meloxicam (MOBIC) 15 MG tablet Take 1 tablet (15 mg total) by mouth daily. 01/16/18   Triplett, Rulon Eisenmenger B, FNP  metroNIDAZOLE (FLAGYL) 500 MG tablet Take 1 tablet (500 mg total) by mouth 3 (three) times daily. 03/18/18   Tommi Rumps, PA-C  oxyCODONE-acetaminophen (PERCOCET) 5-325 MG tablet Take 1 tablet by mouth every 6 (six) hours as needed for severe pain. 03/17/18 03/17/19  Minna Antis, MD    Allergies Penicillins; Sulfa antibiotics; Tramadol; and Vicodin [hydrocodone-acetaminophen]  Family History  Problem Relation Age of Onset  . Cancer Mother   . CAD Other     Social History Social History   Tobacco Use   . Smoking status: Current Every Day Smoker    Packs/day: 1.00    Types: Cigarettes  . Smokeless tobacco: Never Used  Substance Use Topics  . Alcohol use: Yes  . Drug use: Yes    Types: Marijuana     Review of Systems  Constitutional: Positive for fever. Eyes: No visual changes. No discharge. ENT: Positive for congestion and rhinorrhea. Cardiovascular: No chest pain. Respiratory: Positive for cough. No SOB. Gastrointestinal: No abdominal pain.  No nausea, no vomiting.  No diarrhea.  No constipation. Musculoskeletal: Negative for musculoskeletal pain. Skin: Negative for rash, abrasions, lacerations, ecchymosis. Neurological: Negative for headaches.   ____________________________________________   PHYSICAL EXAM:  VITAL SIGNS: ED Triage Vitals  Enc Vitals Group     BP 08/26/18 1348 (!) 125/96     Pulse Rate 08/26/18 1348 78     Resp 08/26/18 1348 18     Temp 08/26/18 1348 98.5 F (36.9 C)     Temp Source 08/26/18 1348 Oral     SpO2 08/26/18 1348 100 %     Weight 08/26/18 1351 135 lb (61.2 kg)     Height 08/26/18 1351 6' (1.829 m)     Head Circumference --      Peak Flow --      Pain Score 08/26/18 1351 4     Pain Loc --      Pain Edu? --      Excl. in GC? --  Constitutional: Alert and oriented. Well appearing and in no acute distress. Eyes: Conjunctivae are normal. PERRL. EOMI. No discharge. Head: Atraumatic. ENT: Maxillary sinus tenderness.      Ears: Tympanic membranes pearly gray with good landmarks. No discharge.      Nose: Mild congestion/rhinnorhea.      Mouth/Throat: Mucous membranes are moist. Oropharynx non-erythematous. Tonsils not enlarged. No exudates. Uvula midline. Neck: No stridor.   Hematological/Lymphatic/Immunilogical: No cervical lymphadenopathy. Cardiovascular: Normal rate, regular rhythm.  Good peripheral circulation. Respiratory: Normal respiratory effort without tachypnea or retractions. Lungs CTAB. Good air entry to the bases with  no decreased or absent breath sounds. Gastrointestinal: Bowel sounds 4 quadrants. Soft and nontender to palpation. No guarding or rigidity. No palpable masses. No distention. Musculoskeletal: Full range of motion to all extremities. No gross deformities appreciated. Neurologic:  Normal speech and language. No gross focal neurologic deficits are appreciated.  Skin:  Skin is warm, dry and intact. No rash noted. Psychiatric: Mood and affect are normal. Speech and behavior are normal. Patient exhibits appropriate insight and judgement.   ____________________________________________   LABS (all labs ordered are listed, but only abnormal results are displayed)  Labs Reviewed - No data to display ____________________________________________  EKG   ____________________________________________  RADIOLOGY   No results found.  ____________________________________________    PROCEDURES  Procedure(s) performed:    Procedures    Medications  methylPREDNISolone sodium succinate (SOLU-MEDROL) 125 mg/2 mL injection 125 mg (125 mg Intramuscular Given 08/26/18 1452)  doxycycline (VIBRA-TABS) tablet 200 mg (200 mg Oral Given 08/26/18 1454)     ____________________________________________   INITIAL IMPRESSION / ASSESSMENT AND PLAN / ED COURSE  Pertinent labs & imaging results that were available during my care of the patient were reviewed by me and considered in my medical decision making (see chart for details).  Review of the [place] CSRS was performed in accordance of the [facility] prior to dispensing any controlled drugs.     Patient's diagnosis is consistent with sinus infection. Vital signs and exam are reassuring. Patient appears well and is staying well hydrated. Patient should alternate tylenol and ibuprofen for fever. Patient feels comfortable going home. Patient will be discharged home with prescriptions for Doxycycline. Patient is to follow up with PCP as needed or otherwise  directed. Patient is given ED precautions to return to the ED for any worsening or new symptoms.     ____________________________________________  FINAL CLINICAL IMPRESSION(S) / ED DIAGNOSES  Final diagnoses:  Acute non-recurrent maxillary sinusitis      NEW MEDICATIONS STARTED DURING THIS VISIT:  ED Discharge Orders         Ordered    doxycycline (VIBRAMYCIN) 50 MG capsule  2 times daily     08/26/18 1516              This chart was dictated using voice recognition software/Dragon. Despite best efforts to proofread, errors can occur which can change the meaning. Any change was purely unintentional.    Enid Derry, PA-C 08/26/18 1621    Jeanmarie Plant, MD 09/02/18 [phone]

## 2018-08-26 NOTE — ED Notes (Signed)
Pt reports  Sinus  Drainage  Blowing nose  Ye;llow  Mucous return with odor   sorethroat  Swollen gland   Bilateral  earches  Seen here  4 days ago

## 2018-08-31 IMAGING — CR DG CHEST 2V
1 series · 2 of 2 positions shown · non-contrast
Comparison: December 17, 2016

CLINICAL DATA: Chest pain and presyncope.

EXAM:
CHEST - 2 VIEW

[Series 1: dg chest 2 view · 0.14mm/px · 2 of 2 slices shown]
[im 1/2]
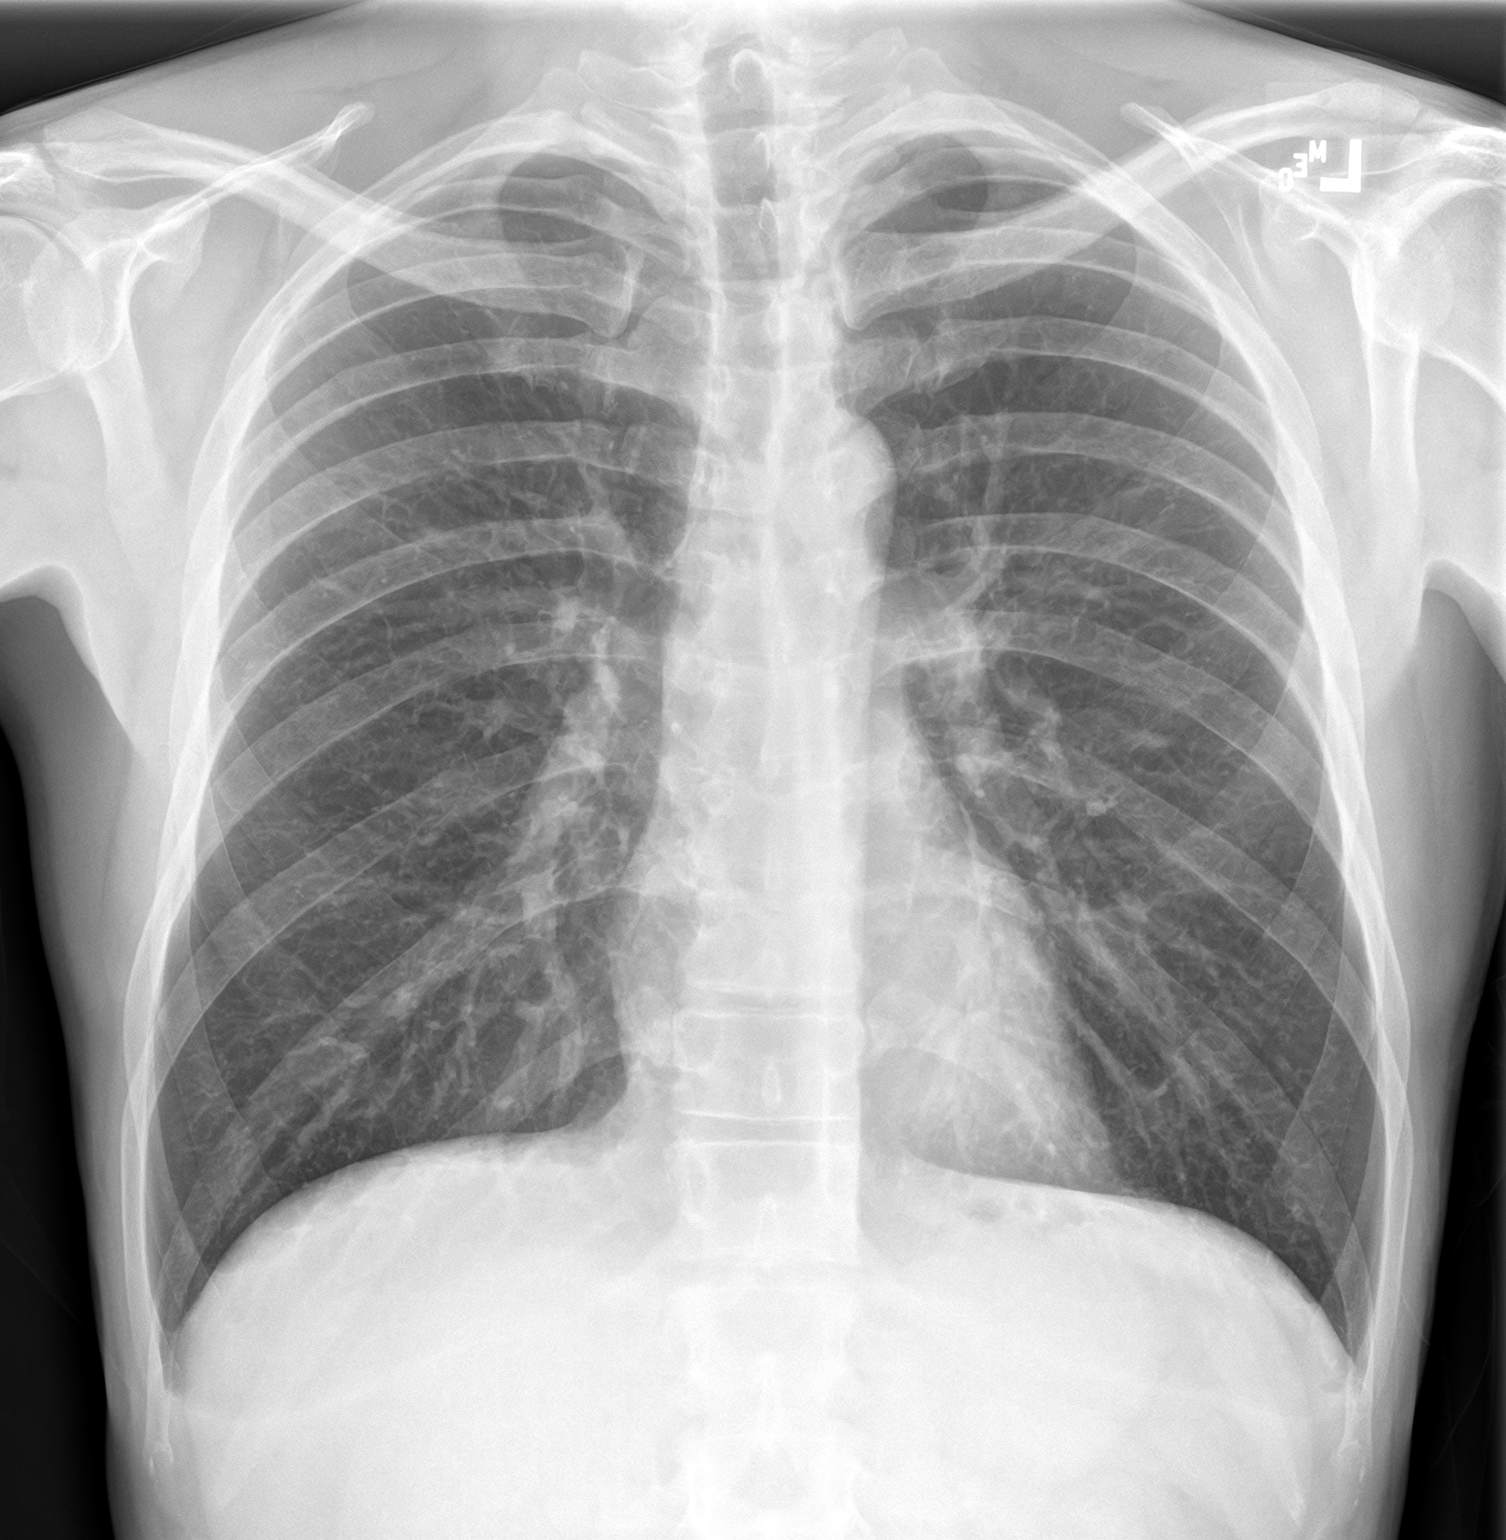
[im 2/2]
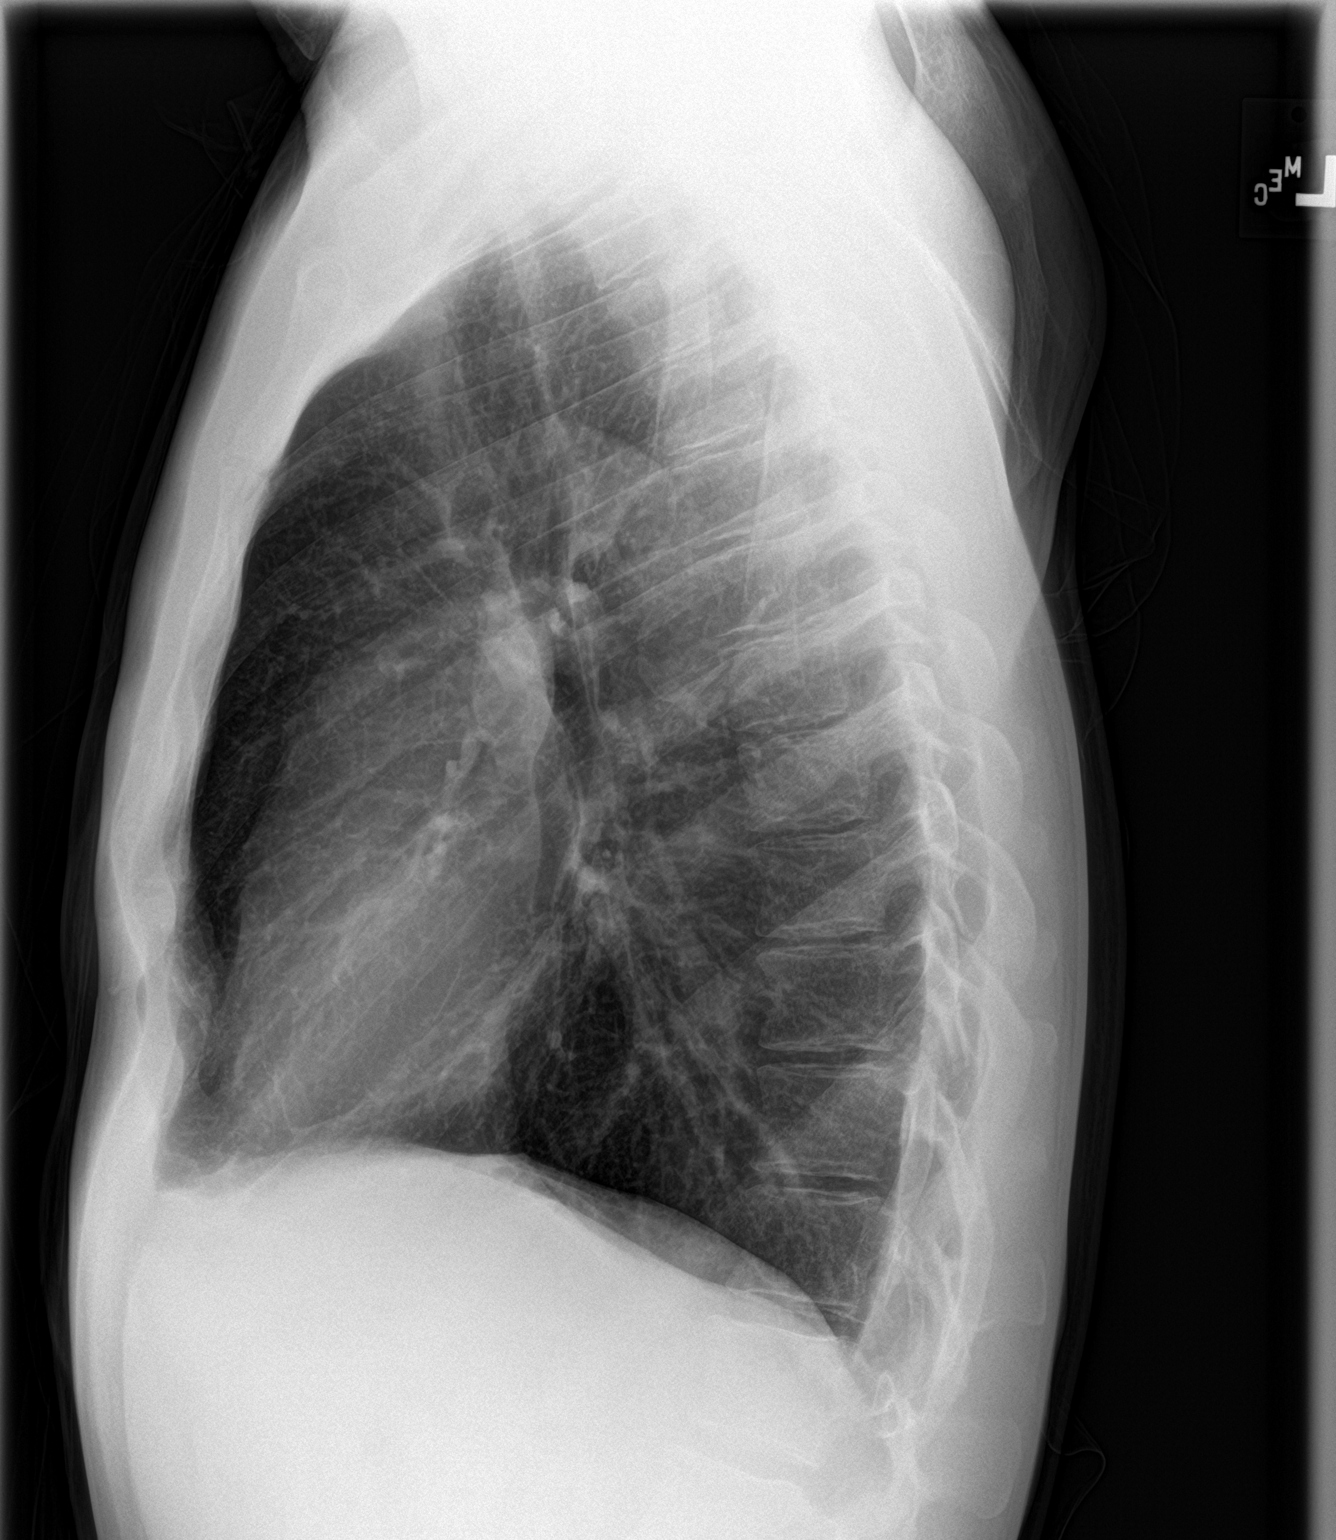

[2 of 2 positions shown; findings below may reference images not displayed]

FINDINGS: The lungs are clear. The heart size and pulmonary vascular normal.
No adenopathy. No pneumothorax. No bone lesions.
IMPRESSION: No edema or consolidation.

## 2020-03-10 ENCOUNTER — Other Ambulatory Visit: Payer: Self-pay

## 2020-03-10 ENCOUNTER — Emergency Department
Admission: EM | Admit: 2020-03-10 | Discharge: 2020-03-10 | Disposition: A | Discharge status: Home / Self Care | Payer: BC Managed Care – PPO | Attending: Emergency Medicine | Admitting: Emergency Medicine

## 2020-03-10 DIAGNOSIS — A691 Other Vincent's infections: Secondary | ICD-10-CM | POA: Diagnosis not present

## 2020-03-10 DIAGNOSIS — Z79899 Other long term (current) drug therapy: Secondary | ICD-10-CM | POA: Insufficient documentation

## 2020-03-10 DIAGNOSIS — F17210 Nicotine dependence, cigarettes, uncomplicated: Secondary | ICD-10-CM | POA: Insufficient documentation

## 2020-03-10 DIAGNOSIS — K0889 Other specified disorders of teeth and supporting structures: Secondary | ICD-10-CM | POA: Diagnosis present

## 2020-03-10 MED ORDER — CLINDAMYCIN HCL 300 MG PO CAPS: 300.0000 mg | ORAL_CAPSULE | Freq: Three times a day (TID) | ORAL | 0 refills | Status: AC

## 2020-03-10 MED ORDER — CHLORHEXIDINE GLUCONATE 0.12 % MT SOLN: 15.0000 mL | Freq: Two times a day (BID) | OROMUCOSAL | 0 refills | Status: DC

## 2020-03-10 MED ORDER — KETOROLAC TROMETHAMINE 30 MG/ML IJ SOLN
30.0000 mg | Freq: Once | INTRAMUSCULAR | Status: AC
Start: 2020-03-10 — End: 2020-03-10
  Administered 2020-03-10: 30 mg via INTRAMUSCULAR
  Filled 2020-03-10: qty 1

## 2020-03-10 MED ORDER — CLINDAMYCIN HCL 150 MG PO CAPS
300.0000 mg | ORAL_CAPSULE | Freq: Once | ORAL | Status: AC
  Administered 2020-03-10: 300 mg via ORAL
  Filled 2020-03-10: qty 2

## 2020-03-10 NOTE — ED Provider Notes (Signed)
[facility] Emergency Department Provider Note   ____________________________________________   First MD Initiated Contact with Patient 03/10/20 [id number]     (approximate)  I have reviewed the triage vital signs and the nursing notes.   HISTORY  Chief Complaint Dental Pain    HPI Lee Ruiz is a 39 y.o. male with no significant past medical history presents to the ED complaining of dental pain.  Patient reports that he has been having a few days of worsening pain to his upper incisors as well as to his hard palate.  He feels like the area is swollen and he is concerned he has an abscess.  He states if he pushes on the area it feels like his teeth are going to fall out.  He reports numerous broken teeth that he has been following with [facility] for, is currently trying to save up for multiple dental extractions.  He has been taking ibuprofen without significant relief of his pain.  He denies any fevers, difficulty opening his jaw, or difficulty swallowing.        Past Medical History:  Diagnosis Date  . Anxiety   . Migraines     There are no problems to display for this patient.   Past Surgical History:  Procedure Laterality Date  . MOUTH SURGERY      Prior to Admission medications   Medication Sig Start Date End Date Taking? Authorizing Provider  chlorhexidine (PERIDEX) 0.12 % solution Use as directed 15 mLs in the mouth or throat 2 (two) times daily. 03/10/20   Chesley Noon, MD  clindamycin (CLEOCIN) 300 MG capsule Take 1 capsule (300 mg total) by mouth 3 (three) times daily for 7 days. 03/10/20 03/17/20  Chesley Noon, MD  loperamide (IMODIUM A-D) 2 MG tablet Take 1 tablet (2 mg total) by mouth 4 (four) times daily as needed for diarrhea or loose stools. 10/06/16   Cuthriell, Delorise Royals, PA-C  meloxicam (MOBIC) 15 MG tablet Take 1 tablet (15 mg total) by mouth daily. 01/16/18   Triplett, Rulon Eisenmenger B, FNP  metroNIDAZOLE (FLAGYL) 500 MG tablet Take 1 tablet  (500 mg total) by mouth 3 (three) times daily. 03/18/18   Tommi Rumps, PA-C    Allergies Penicillins, Sulfa antibiotics, Tramadol, and Vicodin [hydrocodone-acetaminophen]  Family History  Problem Relation Age of Onset  . Cancer Mother   . CAD Other     Social History Social History   Tobacco Use  . Smoking status: Current Every Day Smoker    Packs/day: 1.00    Types: Cigarettes  . Smokeless tobacco: Never Used  Substance Use Topics  . Alcohol use: Yes  . Drug use: Yes    Types: Marijuana    Review of Systems  Constitutional: No fever/chills Eyes: No visual changes. ENT: No sore throat.  Positive for dental pain. Cardiovascular: Denies chest pain. Respiratory: Denies shortness of breath. Gastrointestinal: No abdominal pain.  No nausea, no vomiting.  No diarrhea.  No constipation. Genitourinary: Negative for dysuria. Musculoskeletal: Negative for back pain. Skin: Negative for rash. Neurological: Negative for headaches, focal weakness or numbness.  ____________________________________________   PHYSICAL EXAM:  VITAL SIGNS: ED Triage Vitals [03/10/20 0116]  Enc Vitals Group     BP 129/88     Pulse Rate 92     Resp 16     Temp 98.8 F (37.1 C)     Temp Source Oral     SpO2 99 %     Weight 145 lb (  65.8 kg)     Height 6' (1.829 m)     Head Circumference      Peak Flow      Pain Score 8     Pain Loc      Pain Edu?      Excl. in [place]?     Constitutional: Alert and oriented. Eyes: Conjunctivae are normal. Head: Atraumatic. Nose: No congestion/rhinnorhea. Mouth/Throat: Mucous membranes are moist.  Numerous cracked and decayed teeth with erythema and edema primarily to the area of upper incisors, but no focal fluctuance noted.  Area is diffusely tender to palpation.  No trismus noted, submandibular compartments are soft. Neck: Normal ROM Cardiovascular: Normal rate, regular rhythm. Grossly normal heart sounds. Respiratory: Normal respiratory effort.  No  retractions. Lungs CTAB. Gastrointestinal: Soft and nontender. No distention. Genitourinary: deferred Musculoskeletal: No lower extremity tenderness nor edema. Neurologic:  Normal speech and language. No gross focal neurologic deficits are appreciated. Skin:  Skin is warm, dry and intact. No rash noted. Psychiatric: Mood and affect are normal. Speech and behavior are normal.  ____________________________________________   LABS (all labs ordered are listed, but only abnormal results are displayed)  Labs Reviewed - No data to display   PROCEDURES  Procedure(s) performed (including Critical Care):  Procedures   ____________________________________________   INITIAL IMPRESSION / ASSESSMENT AND PLAN / ED COURSE       39 year old male presents to the ED for a few days of increasing upper anterior dental pain with associated erythema and swelling.  Appearance is concerning for ANUG without any evidence of abscess.  We will treat pain with Toradol and start patient on clindamycin, also prescribed Peridex washes.  He was given information for low-cost dentists and counseled to schedule follow-up, otherwise return to the ED for new or worsening symptoms.  Patient agrees with plan.      ____________________________________________   FINAL CLINICAL IMPRESSION(S) / ED DIAGNOSES  Final diagnoses:  ANUG (acute necrotizing ulcerative gingivitis)     ED Discharge Orders         Ordered    clindamycin (CLEOCIN) 300 MG capsule  3 times daily     03/10/20 0340    chlorhexidine (PERIDEX) 0.12 % solution  2 times daily     03/10/20 0340           Note:  This document was prepared using Dragon voice recognition software and may include unintentional dictation errors.   Blake Divine, MD 03/10/20 0400

## 2020-03-10 NOTE — ED Triage Notes (Signed)
Pt complains of left upper jaw pain and palate pain. Pt states he believes he has an abscess to area. Pt appears in no acute distress.

## 2020-08-19 ENCOUNTER — Emergency Department
Admission: EM | Admit: 2020-08-19 | Discharge: 2020-08-19 | Disposition: A | Discharge status: Home / Self Care | Payer: BC Managed Care – PPO | Attending: Emergency Medicine | Admitting: Emergency Medicine

## 2020-08-19 ENCOUNTER — Encounter: Payer: Self-pay | Admitting: Physician Assistant

## 2020-08-19 ENCOUNTER — Other Ambulatory Visit: Payer: Self-pay

## 2020-08-19 DIAGNOSIS — K029 Dental caries, unspecified: Secondary | ICD-10-CM | POA: Insufficient documentation

## 2020-08-19 DIAGNOSIS — K047 Periapical abscess without sinus: Secondary | ICD-10-CM | POA: Insufficient documentation

## 2020-08-19 DIAGNOSIS — F17210 Nicotine dependence, cigarettes, uncomplicated: Secondary | ICD-10-CM | POA: Diagnosis not present

## 2020-08-19 DIAGNOSIS — K0889 Other specified disorders of teeth and supporting structures: Secondary | ICD-10-CM | POA: Diagnosis present

## 2020-08-19 LAB — BASIC METABOLIC PANEL
Anion gap: 10 (ref 5–15)
BUN: 10 mg/dL (ref 6–20)
CO2: 23 mmol/L (ref 22–32)
Calcium: 8.9 mg/dL (ref 8.9–10.3)
Chloride: 104 mmol/L (ref 98–111)
Creatinine, Ser: 0.73 mg/dL (ref 0.61–1.24)
GFR, Estimated: >60 mL/min (ref >60)
Glucose, Bld: 100 mg/dL — ABNORMAL HIGH (ref 70–99)
Potassium: 4 mmol/L (ref 3.5–5.1)
Sodium: 137 mmol/L (ref 135–145)

## 2020-08-19 LAB — CBC WITH DIFFERENTIAL/PLATELET
Abs Immature Granulocytes: 0.04 10*3/uL (ref 0.00–0.07)
Basophils Absolute: 0.1 10*3/uL (ref 0.0–0.1)
Basophils Relative: 0 %
Eosinophils Absolute: 0.3 10*3/uL (ref 0.0–0.5)
Eosinophils Relative: 2 %
HCT: 41.8 % (ref 39.0–52.0)
Hemoglobin: 14.1 g/dL (ref 13.0–17.0)
Immature Granulocytes: 0 %
Lymphocytes Relative: 18 %
Lymphs Abs: 2.3 10*3/uL (ref 0.7–4.0)
MCH: 32.1 pg (ref 26.0–34.0)
MCHC: 33.7 g/dL (ref 30.0–36.0)
MCV: 95.2 fL (ref 80.0–100.0)
Monocytes Absolute: 1.2 10*3/uL — ABNORMAL HIGH (ref 0.1–1.0)
Monocytes Relative: 9 %
Neutro Abs: 9.4 10*3/uL — ABNORMAL HIGH (ref 1.7–7.7)
Neutrophils Relative %: 71 %
Platelets: 249 10*3/uL (ref 150–400)
RBC: 4.39 MIL/uL (ref 4.22–5.81)
RDW: 12.3 % (ref 11.5–15.5)
WBC: 13.4 10*3/uL — ABNORMAL HIGH (ref 4.0–10.5)
nRBC: 0 % (ref 0.0–0.2)

## 2020-08-19 MED ORDER — KETOROLAC TROMETHAMINE 10 MG PO TABS: 10.0000 mg | ORAL_TABLET | Freq: Three times a day (TID) | ORAL | 0 refills | Status: DC

## 2020-08-19 MED ORDER — CLINDAMYCIN PHOSPHATE 600 MG/50ML IV SOLN
600.0000 mg | Freq: Once | INTRAVENOUS | Status: AC
  Administered 2020-08-19: 600 mg via INTRAVENOUS
  Filled 2020-08-19: qty 50

## 2020-08-19 MED ORDER — HYDROCODONE-ACETAMINOPHEN 5-325 MG PO TABS: 1.0000 | ORAL_TABLET | Freq: Three times a day (TID) | ORAL | 0 refills | Status: AC | PRN

## 2020-08-19 MED ORDER — CHLORHEXIDINE GLUCONATE 0.12 % MT SOLN: 15.0000 mL | Freq: Two times a day (BID) | OROMUCOSAL | 0 refills | Status: DC

## 2020-08-19 MED ORDER — CLINDAMYCIN HCL 300 MG PO CAPS: 300.0000 mg | ORAL_CAPSULE | Freq: Three times a day (TID) | ORAL | 0 refills | Status: AC

## 2020-08-19 MED ORDER — KETOROLAC TROMETHAMINE 30 MG/ML IJ SOLN
30.0000 mg | Freq: Once | INTRAMUSCULAR | Status: AC
  Administered 2020-08-19: 30 mg via INTRAVENOUS
  Filled 2020-08-19: qty 1

## 2020-08-19 NOTE — Discharge Instructions (Addendum)
Take the antibiotic and pain medicine as directed. Follow-up with [facility] or a dental provider on the list below for dental extractions.   OPTIONS FOR DENTAL FOLLOW UP CARE  [place] [facility] - Local Safety Net Dental Clinics [email or web address]   [facility] [phone])  [facility]arl Ma [phone])  [place] [phone])  [facility] [phone])  [facility] [phone]) This clinic caters to the indigent population and is on a lottery system. Location: [employer] [facility], [employer], [address] [place] Clinic Hours: Wednesdays from 6pm - 9pm, patients seen by a lottery system. For dates, call or go to [email or web address] Services: Cleanings, fillings and simple extractions. Payment Options: DENTAL WORK IS FREE OF CHARGE. Bring proof of income or support. Best way to get seen: Arrive at 5:15 pm - this is a lottery, NOT first come/first serve, so arriving earlier will not increase your chances of being seen.     [facility] [phone] Select option 1 for emergencies   Location: [facility], [place], [address] [place] Clinic Hours: No walk-ins accepted - call the day before to schedule an appointment. Check in times are 9:30 am and 1:30 pm. Services: Simple extractions, temporary fillings, pulpectomy/pulp debridement, uncomplicated abscess drainage. Payment Options: PAYMENT IS DUE AT THE TIME OF SERVICE.  Fee is usually $100-200, additional surgical procedures (e.g. abscess drainage) may be extra. Cash, checks, Visa/MasterCard accepted.  Can file Medicaid if patient is covered for dental - patient should call case worker to check. No discount for [facility] patients. Best way to get seen: MUST call the day before  and get onto the schedule. Can usually be seen the next 1-2 days. No walk-ins accepted.     [facility] [phone]   Location: [facility], [address] [place] Clinic Hours: M, W, Th, F 8am or 1:30pm, Tues 9a or 1:30 - first come/first served. Services: Simple extractions, temporary fillings, uncomplicated abscess drainage.  You do not need to be an [facility] resident. Payment Options: PAYMENT IS DUE AT THE TIME OF SERVICE. Dental insurance, otherwise sliding scale - bring proof of income or support. Depending on income and treatment needed, cost is usually $50-200. Best way to get seen: Arrive early as it is first come/first served.     [facility] [phone]   Location: [address] Clinic Hours: Mon-Thu 8a-5p Services: Most basic dental services including extractions and fillings. Payment Options: PAYMENT IS DUE AT THE TIME OF SERVICE. Sliding scale, up to 50% off - bring proof if income or support. Medicaid with dental option accepted. Best way to get seen: Call to schedule an appointment, can usually be seen within 2 weeks OR they will try to see walk-ins - show up at 8a or 2p (you may have to wait).     [facility] [phone] [place] RESIDENTS ONLY   Location: [facility], [address] [place], [place] [address] Clinic Hours: By appointment only. Monday - Thursday 8am-5pm, Friday 8am-12pm Services: Cleanings, fillings, extractions. Payment Options: PAYMENT IS DUE AT THE TIME OF SERVICE. Cash, Visa or MasterCard. Sliding scale - $30 minimum per service. Best way to get seen: Come in to office, complete packet and make an appointment - need proof of income or support monies for each household member and proof of [facility]  residence. Usually takes about a month to get in.     [facility] [phone]   Location: [address], [place] Clinic Hours: Walk-in Urgent Care Dental Services are offered Monday-Friday mornings only. The numbers of emergencies accepted daily is limited to the number of providers available. Maximum 15 - Mondays, Wednesdays & Thursdays Maximum 10 - Tuesdays & Fridays Services: You do not need to be a [facility] resident to be seen for a dental emergency. Emergencies are defined as pain, swelling, abnormal bleeding, or dental trauma. Walkins will receive x-rays if needed. NOTE: Dental cleaning is not an emergency. Payment Options: PAYMENT IS DUE AT THE TIME OF SERVICE. Minimum co-pay is $40.00 for uninsured patients. Minimum co-pay is $3.00 for Medicaid with dental coverage. Dental Insurance is accepted and must be presented at time of visit. Medicare does not cover dental. Forms of payment: Cash, credit card, checks. Best way to get seen: If not previously registered with the clinic, walk-in dental registration begins at 7:15 am and is on a first come/first serve basis. If previously registered with the clinic, call to make an appointment.     [facility] [place] ONLY   Location: [address] [place], [place] Clinic Hours: Mon-Thu 10a-2p Services: Extractions only! Payment Options: FREE (donations accepted) - bring proof of income or support Best way to get seen: Call and schedule an appointment OR come at 8am on the 1st Monday of every month (except for holidays) when it is first come/first served.     [facility] [phone]   Location: [place], [place] Clinic Hours: Friday mornings Services, Payment Options, Best way to get seen: Call for info

## 2020-08-19 NOTE — ED Triage Notes (Signed)
Pt arrived via POV with reports of 2 days of dental pain, right side facial swelling noted. Pt reports he has several bad teeth on the right side.

## 2020-08-19 NOTE — ED Provider Notes (Signed)
[facility] Emergency Department Provider Note ____________________________________________  Time seen: 1615  I have reviewed the triage vital signs and the nursing notes.  HISTORY  Chief Complaint  Dental Pain  HPI Lee Ruiz is a 39 y.o. male presents himself to the ED for evaluation of acute  right-sided dental pain.  Patient reports several severely decayed teeth on the right.  He reports some right facial swelling.  Onset was 2 days prior.  Denies any interim fever, chills, or sweats.  He has been able to control oral secretions, eat, drink and denies any difficulty with breathing.  Patient reports that he has dental insurance, but is unable to secure an appointment with the dental provider because they are only available on the days that he works.  He also reports he has been told in the past that he has to see a oral maxillofacial surgeon for dental extractions.  Past Medical History:  Diagnosis Date  . Anxiety   . Migraines     There are no problems to display for this patient.   Past Surgical History:  Procedure Laterality Date  . MOUTH SURGERY      Prior to Admission medications   Medication Sig Start Date End Date Taking? Authorizing Provider  chlorhexidine (PERIDEX) 0.12 % solution Use as directed 15 mLs in the mouth or throat 2 (two) times daily. 08/19/20   Dearion Huot, Charlesetta Ivory, PA-C  clindamycin (CLEOCIN) 300 MG capsule Take 1 capsule (300 mg total) by mouth 3 (three) times daily for 10 days. 08/19/20 08/29/20  Tamarcus Condie, Charlesetta Ivory, PA-C  ketorolac (TORADOL) 10 MG tablet Take 1 tablet (10 mg total) by mouth every 8 (eight) hours. 08/19/20   Kimmy Totten, Charlesetta Ivory, PA-C    Allergies Penicillins, Sulfa antibiotics, Sulfacetamide sodium, Tramadol, and Vicodin [hydrocodone-acetaminophen]  Family History  Problem Relation Age of Onset  . Cancer Mother   . CAD Other     Social History Social History   Tobacco Use  . Smoking  status: Current Every Day Smoker    Packs/day: 1.00    Types: Cigarettes  . Smokeless tobacco: Never Used  Substance Use Topics  . Alcohol use: Yes  . Drug use: Yes    Types: Marijuana    Review of Systems  Constitutional: Negative for fever. Eyes: Negative for visual changes. ENT: Negative for sore throat.  Dental pain and facial swelling as above. Cardiovascular: Negative for chest pain. Respiratory: Negative for shortness of breath. Gastrointestinal: Negative for abdominal pain, vomiting and diarrhea. Genitourinary: Negative for dysuria. Musculoskeletal: Negative for back pain. Skin: Negative for rash. Neurological: Negative for headaches, focal weakness or numbness. ____________________________________________  PHYSICAL EXAM:  VITAL SIGNS: ED Triage Vitals [08/19/20 1421]  Enc Vitals Group     BP 129/90     Pulse Rate 85     Resp 18     Temp 98.3 F (36.8 C)     Temp Source Oral     SpO2 100 %     Weight 140 lb (63.5 kg)     Height 6' (1.829 m)     Head Circumference      Peak Flow      Pain Score 10     Pain Loc      Pain Edu?      Excl. in [place]?     Constitutional: Alert and oriented. Well appearing and in no distress. Head: Normocephalic and atraumatic. Eyes: Conjunctivae are normal. PERRL. Normal extraocular movements Ears:  Canals clear. TMs intact bilaterally. Nose: No congestion/rhinorrhea/epistaxis. Mouth/Throat: Mucous membranes are moist.  Uvula is midline and tonsils are flat.  No oral lesions appreciated.  Poor general dentition noted globally.  Multiple teeth decayed to the level of the gumline.  There is facial swelling noted from the right nasolabial fold to the lower right mandible.  No focal gum swelling, pointing fluctuance, or appreciable gumline abscess is noted.  No brawny symmetrical erythema is noted. Neck: Supple. No thyromegaly. Hematological/Lymphatic/Immunological: No cervical lymphadenopathy. Cardiovascular: Normal rate, regular  rhythm. Normal distal pulses. Respiratory: Normal respiratory effort. No wheezes/rales/rhonchi. Psychiatric: Mood and affect are normal. Patient exhibits appropriate insight and judgment. ____________________________________________   LABS (pertinent positives/negatives) Labs Reviewed  BASIC METABOLIC PANEL - Abnormal; Notable for the following components:      Result Value   Glucose, Bld 100 (*)    All other components within normal limits  CBC WITH DIFFERENTIAL/PLATELET - Abnormal; Notable for the following components:   WBC 13.4 (*)    Neutro Abs 9.4 (*)    Monocytes Absolute 1.2 (*)    All other components within normal limits  ____________________________________________  PROCEDURES  Clindamycin 600 g IVPB Toradol 30 mg IVP  Procedures ____________________________________________  INITIAL IMPRESSION / ASSESSMENT AND PLAN / ED COURSE  DDX:  Dental abscess, Ludwig's angina, tonsillitis, necrotizing gingivitis  Patient with previous ED evaluation and management of acute dental abscess on top of chronic arrested caries and gingivitis.  Patient clinical picture is again consistent with a likely dental abscess secondary to dental caries.  He will be treated empirically given the moderate amount of facial swelling with an IV dose of clindamycin.  He will be discharged with prescription for the same as well as prescription for peri-Dex, Toradol, and a small prescription for hydrocodone.  Patient is again referred to any number of dental clinics and advised that he must seek dental care for definitive management of his symptoms.  Return precautions have been discussed.  Lee Ruiz was evaluated in Emergency Department on 08/23/2020 for the symptoms described in the history of present illness. He was evaluated in the context of the global COVID-19 pandemic, which necessitated consideration that the patient might be at risk for infection with the SARS-CoV-2 virus that causes COVID-19.  Institutional protocols and algorithms that pertain to the evaluation of patients at risk for COVID-19 are in a state of rapid change based on information released by regulatory bodies including the CDC and federal and state organizations. These policies and algorithms were followed during the patient's care in the ED.  I reviewed the patient's prescription history over the last 12 months in the multi-state controlled substances database(s) that includes [place], [place], [place], [place], [place], [place], [place], [place], [place], [place], [place], [place], [place], and [place].  Results were notable for no current RX. ____________________________________________  FINAL CLINICAL IMPRESSION(S) / ED DIAGNOSES  Final diagnoses:  Dental abscess  Pain due to dental caries  Infected dental caries      Karmen Stabs Charlesetta Ivory, PA-C 08/23/20 1029    Phineas Semen, MD 08/23/20 1528

## 2021-11-29 ENCOUNTER — Emergency Department: Payer: Self-pay

## 2021-11-29 ENCOUNTER — Encounter: Payer: Self-pay | Admitting: Emergency Medicine

## 2021-11-29 ENCOUNTER — Emergency Department
Admission: EM | Admit: 2021-11-29 | Discharge: 2021-11-29 | Disposition: A | Discharge status: Home / Self Care | Payer: Self-pay | Attending: Emergency Medicine | Admitting: Emergency Medicine

## 2021-11-29 DIAGNOSIS — S61012A Laceration without foreign body of left thumb without damage to nail, initial encounter: Secondary | ICD-10-CM | POA: Insufficient documentation

## 2021-11-29 DIAGNOSIS — W25XXXA Contact with sharp glass, initial encounter: Secondary | ICD-10-CM | POA: Insufficient documentation

## 2021-11-29 DIAGNOSIS — Z23 Encounter for immunization: Secondary | ICD-10-CM | POA: Insufficient documentation

## 2021-11-29 MED ORDER — LIDOCAINE HCL (PF) 1 % IJ SOLN
5.0000 mL | Freq: Once | INTRAMUSCULAR | Status: AC
Start: 2021-11-29 — End: 2021-11-29
  Administered 2021-11-29: 5 mL via INTRADERMAL
  Filled 2021-11-29 (×2): qty 5

## 2021-11-29 MED ORDER — TETANUS-DIPHTH-ACELL PERTUSSIS 5-2.5-18.5 LF-MCG/0.5 IM SUSY
0.5000 mL | PREFILLED_SYRINGE | Freq: Once | INTRAMUSCULAR | Status: AC
  Administered 2021-11-29: 0.5 mL via INTRAMUSCULAR
  Filled 2021-11-29: qty 0.5

## 2021-11-29 NOTE — ED Notes (Signed)
Xray at BS 

## 2021-11-29 NOTE — ED Notes (Signed)
Lidocaine requested from pharmacy

## 2021-11-29 NOTE — ED Notes (Signed)
Approximated well. No active bleeding. Xeroform, kling, and coban dressing applied. Denies pain at this time.

## 2021-11-29 NOTE — ED Triage Notes (Signed)
Patient ambulatory to triage with steady gait, without difficulty or distress noted; pt reports laceration to left thumb; st his wife dropped a crystal plate, he kicked it and it broke and cut his finger when he went to pick it up

## 2021-11-29 NOTE — Discharge Instructions (Signed)
Suture should be removed in 7 days.  You may go to your regular doctor, urgent care, return emergency department, or remove them yourself.  If you see any sign of infection you should return emergency department or see your regular doctor.  Your tetanus is good for 10 years.

## 2021-11-29 NOTE — ED Provider Notes (Signed)
[facility] Provider Note    Event Date/Time   First MD Initiated Contact with Patient 11/29/21 [phone]     (approximate)   History   Laceration   HPI  Lee Ruiz is a 41 y.o. male complains of a laceration to the left thumb.  Patient states it was cut on a glass plate.  Patient states he is having severe pain.  States laceration occurred around 4 AM.  Unsure of his last Tdap.      Physical Exam   Triage Vital Signs: ED Triage Vitals  Enc Vitals Group     BP 11/29/21 0530 (!) 122/97     Pulse Rate 11/29/21 0530 92     Resp 11/29/21 0530 18     Temp 11/29/21 0530 98.6 F (37 C)     Temp Source 11/29/21 0530 Oral     SpO2 11/29/21 0530 97 %     Weight 11/29/21 0530 145 lb (65.8 kg)     Height 11/29/21 0530 6' (1.829 m)     Head Circumference --      Peak Flow --      Pain Score 11/29/21 0531 8     Pain Loc --      Pain Edu? --      Excl. in GC? --     Most recent vital signs: Vitals:   11/29/21 0530  BP: (!) 122/97  Pulse: 92  Resp: 18  Temp: 98.6 F (37 C)  SpO2: 97%     General: Awake, no distress.   CV:  Good peripheral perfusion. regular rate and  rhythm Resp:  Normal effort. Abd:  No distention.   Other:  Left thumb has a deep laceration at the distal aspect, active bleeding is noted, neurovascular is intact, patient does have range of motion   ED Results / Procedures / Treatments   Labs (all labs ordered are listed, but only abnormal results are displayed) Labs Reviewed - No data to display   EKG     RADIOLOGY X-ray of the left thumb    PROCEDURES:   .Marland KitchenLaceration Repair  Date/Time: 11/29/2021 12:56 PM Performed by: Faythe Ghee, PA-C Authorized by: Faythe Ghee, PA-C   Consent:    Consent obtained:  Verbal   Consent given by:  Patient   Risks, benefits, and alternatives were discussed: yes     Risks discussed:  Infection, retained foreign body, pain, need for additional repair, poor  cosmetic result, tendon damage, vascular damage, poor wound healing and nerve damage   Alternatives discussed:  No treatment Universal protocol:    Procedure explained and questions answered to patient or proxy's satisfaction: yes     Patient identity confirmed:  Verbally with patient Anesthesia:    Anesthesia method:  Nerve block   Block needle gauge:  27 G   Block anesthetic:  Lidocaine 1% w/o epi   Block injection procedure:  Anatomic landmarks identified, introduced needle, incremental injection, negative aspiration for blood and anatomic landmarks palpated   Block outcome:  Anesthesia achieved Laceration details:    Location:  Finger   Finger location:  L thumb   Length (cm):  2 Pre-procedure details:    Preparation:  Patient was prepped and draped in usual sterile fashion and imaging obtained to evaluate for foreign bodies Exploration:    Hemostasis achieved with:  Direct pressure   Imaging obtained: x-ray     Imaging outcome: foreign body not noted  Wound exploration: wound explored through full range of motion     Wound extent: no areolar tissue violation noted, no fascia violation noted, no foreign bodies/material noted, no muscle damage noted, no nerve damage noted, no tendon damage noted, no underlying fracture noted and no vascular damage noted     Contaminated: no   Treatment:    Area cleansed with:  Povidone-iodine and saline   Amount of cleaning:  Standard   Irrigation solution:  Sterile saline   Irrigation method:  Tap and syringe   Debridement:  None   Undermining:  None   Scar revision: no   Skin repair:    Repair method:  Sutures   Suture size:  5-0   Suture material:  Nylon   Suture technique:  Simple interrupted   Number of sutures:  4 Approximation:    Approximation:  Close Repair type:    Repair type:  Simple Post-procedure details:    Dressing:  Non-adherent dressing   Procedure completion:  Tolerated well, no immediate  complications   MEDICATIONS ORDERED IN ED: Medications  Tdap (BOOSTRIX) injection 0.5 mL (0.5 mLs Intramuscular Given 11/29/21 0906)  lidocaine (PF) (XYLOCAINE) 1 % injection 5 mL (5 mLs Intradermal Given by Other 11/29/21 0913)     IMPRESSION / MDM / ASSESSMENT AND PLAN / ED COURSE  I reviewed the triage vital signs and the nursing notes.                              Differential diagnosis includes, but is not limited to, laceration, tendon laceration, fracture, foreign body  X-ray of the left thumb was reviewed by me, did not see a foreign body or fracture of the bone.  Radiologist read is normal  See procedure note for laceration repair  Patient's Tdap was updated  He is to follow-up with his regular doctor, urgent care, or return emergency department in 7 days for suture removal.  Return if any sign of infection.  Is given a work note stating he would not be able to use the left  hand for 1 week.  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  He was given a work note.  Discharged stable condition.  In agreement treatment plan       FINAL CLINICAL IMPRESSION(S) / ED DIAGNOSES   Final diagnoses:  Laceration of left thumb without foreign body without damage to nail, initial encounter     Rx / DC Orders   ED Discharge Orders     None        Note:  This document was prepared using Dragon voice recognition software and may include unintentional dictation errors.    Faythe Ghee, PA-C 11/29/21 1300    Delton [place], MD 11/29/21 [phone]

## 2021-11-29 NOTE — ED Notes (Signed)
Pt to ED for laceration to L thumb. States got deep laceration to thumb while trying to pick up a broken plate. States thumb was bleeding profusely. Thumb is wrapped in gauze at this time. Complains of severe pain. Is able to move thumb without difficulty.   Pt came in EMS. States had drank 3 beers before this happened. Event occurred around 0400 this morning.

## 2021-11-29 NOTE — ED Notes (Addendum)
EDPA at [facility] for digital nerve block. L thumb lac. Occurred PTA from broken crystal plate. Bleeding controlled with pressure, elevation and gauze.

## 2022-02-11 IMAGING — DX DG FINGER THUMB 2+V*L*
3 series · 3 of 3 positions shown · non-contrast
Comparison: None.

CLINICAL DATA: Laceration with glass plate.

EXAM:
LEFT THUMB 2+V

[finger ap]
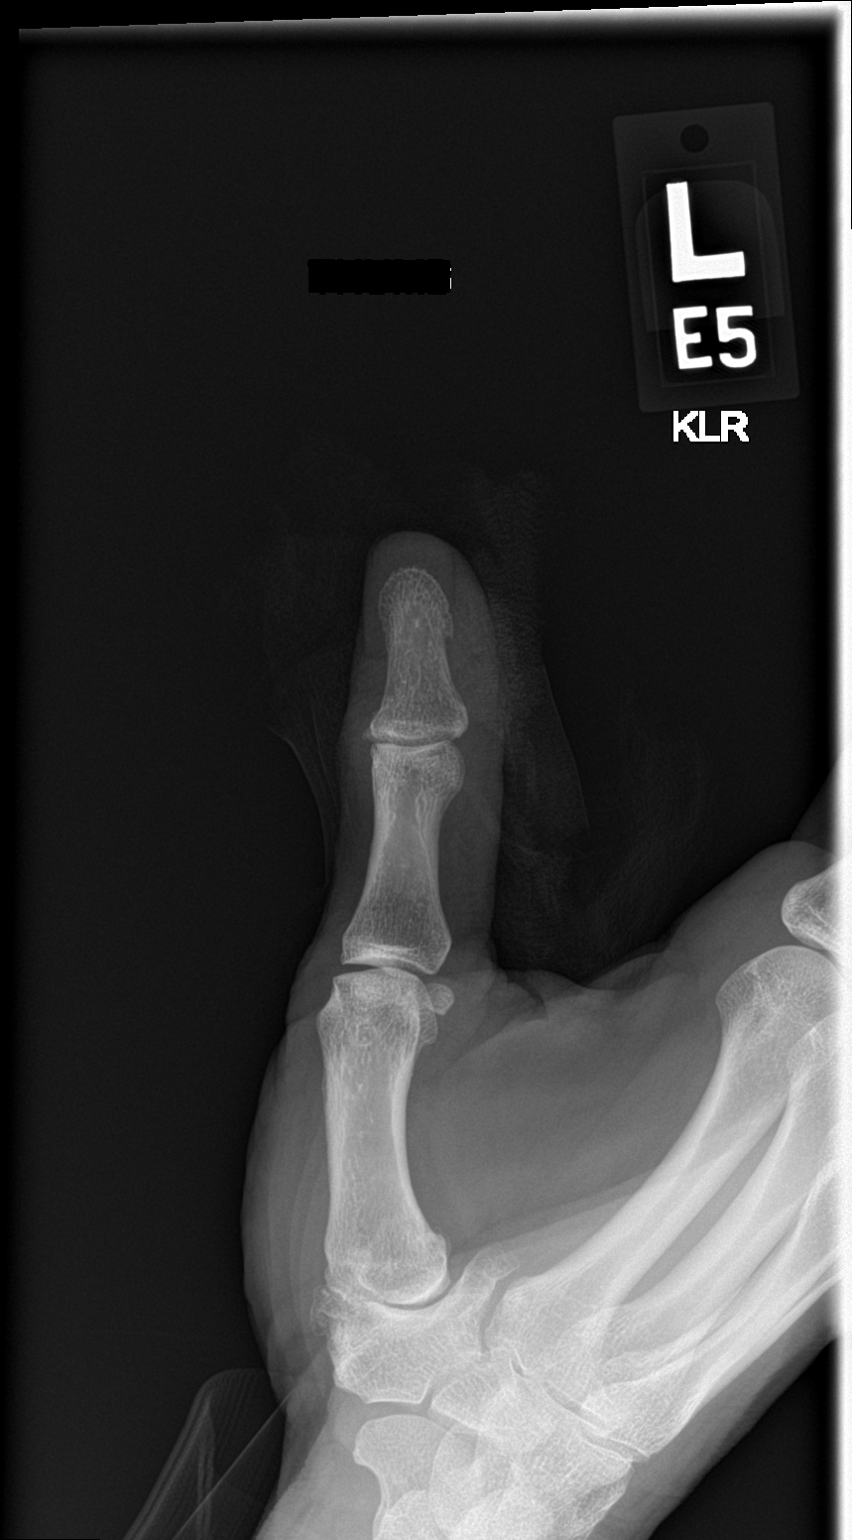

[finger obl]
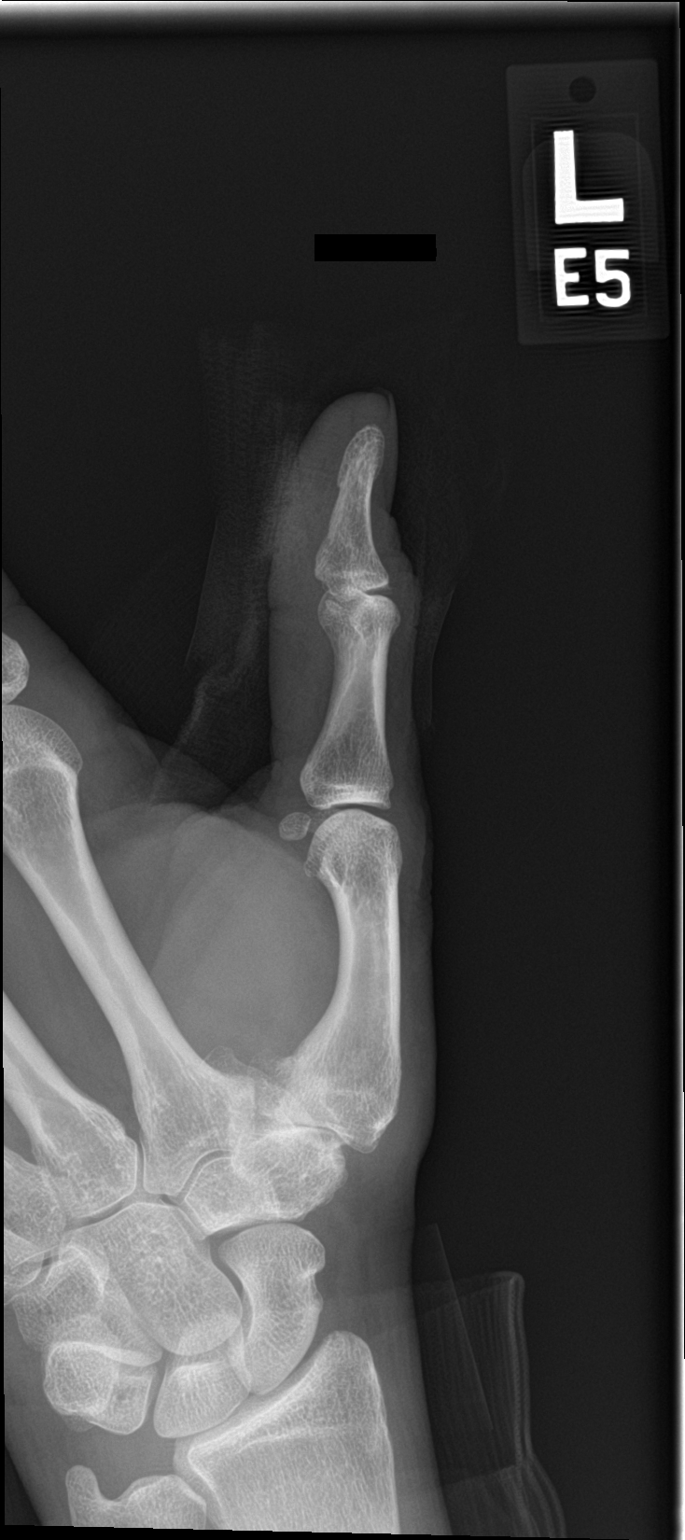

[finger lat]
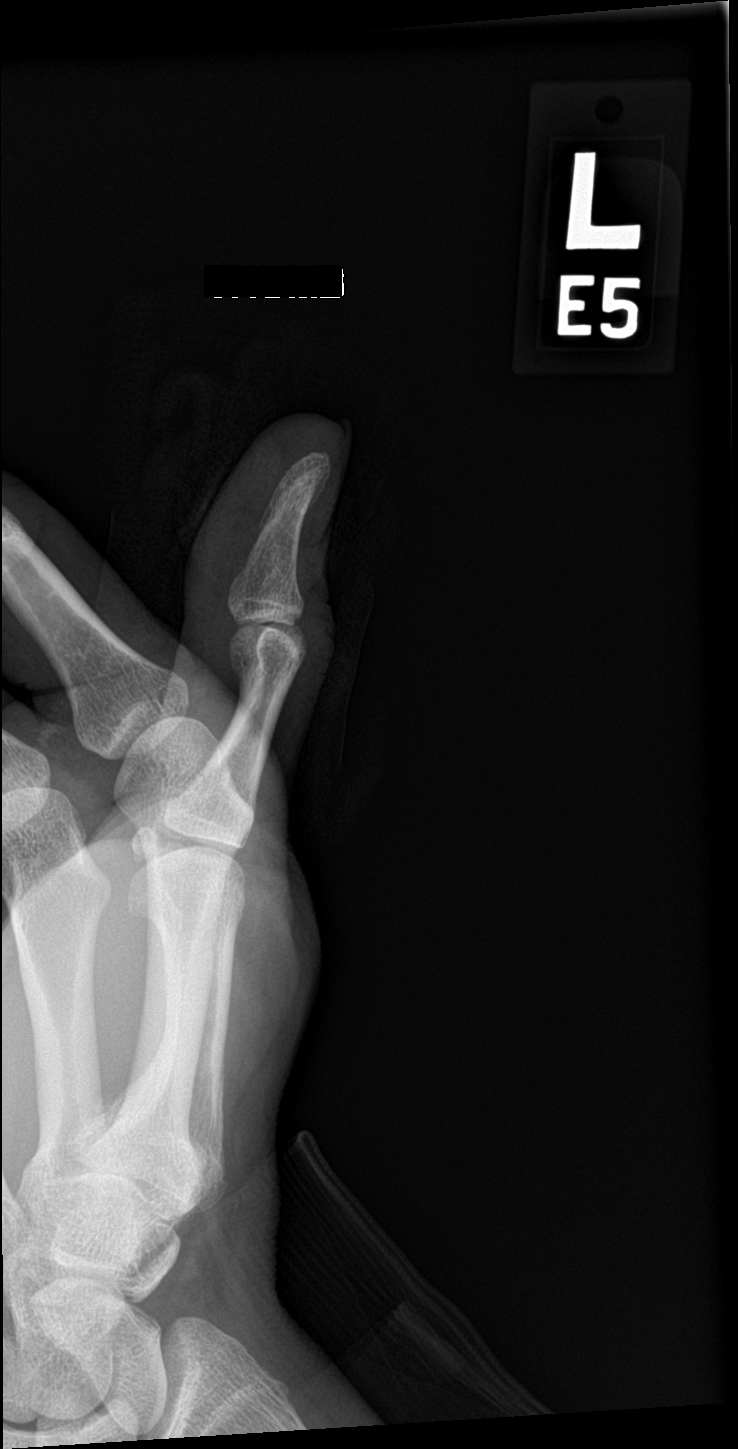

[3 of 3 positions shown; findings below may reference images not displayed]

FINDINGS: There is no evidence of fracture or dislocation. Mild arthropathy of
the first carpometacarpal joint. Soft tissue swelling about the
distal phalanx of the first digit without evidence of radiopaque
foreign body.
IMPRESSION: 1.  No acute fracture or dislocation.

2.  No radiopaque foreign body.

## 2022-03-18 ENCOUNTER — Emergency Department
Admission: EM | Admit: 2022-03-18 | Discharge: 2022-03-18 | Discharge status: Against Medical Advice | Payer: Self-pay | Source: Home / Self Care

## 2022-03-26 ENCOUNTER — Encounter: Payer: Self-pay | Admitting: Emergency Medicine

## 2022-03-26 ENCOUNTER — Emergency Department
Admission: EM | Admit: 2022-03-26 | Discharge: 2022-03-26 | Disposition: A | Discharge status: Home / Self Care | Payer: BC Managed Care – PPO | Attending: Emergency Medicine | Admitting: Emergency Medicine

## 2022-03-26 ENCOUNTER — Other Ambulatory Visit: Payer: Self-pay

## 2022-03-26 DIAGNOSIS — X500XXA Overexertion from strenuous movement or load, initial encounter: Secondary | ICD-10-CM | POA: Insufficient documentation

## 2022-03-26 DIAGNOSIS — R1084 Generalized abdominal pain: Secondary | ICD-10-CM | POA: Insufficient documentation

## 2022-03-26 DIAGNOSIS — R109 Unspecified abdominal pain: Secondary | ICD-10-CM | POA: Diagnosis present

## 2022-03-26 LAB — COMPREHENSIVE METABOLIC PANEL
ALT: 28 U/L (ref 0–44)
AST: 28 U/L (ref 15–41)
Albumin: 4 g/dL (ref 3.5–5.0)
Alkaline Phosphatase: 48 U/L (ref 38–126)
Anion gap: 8 (ref 5–15)
BUN: 10 mg/dL (ref 6–20)
CO2: 24 mmol/L (ref 22–32)
Calcium: 8.6 mg/dL — ABNORMAL LOW (ref 8.9–10.3)
Chloride: 104 mmol/L (ref 98–111)
Creatinine, Ser: 0.76 mg/dL (ref 0.61–1.24)
GFR, Estimated: >60 mL/min (ref >60)
Glucose, Bld: 97 mg/dL (ref 70–99)
Potassium: 3.6 mmol/L (ref 3.5–5.1)
Sodium: 136 mmol/L (ref 135–145)
Total Bilirubin: 0.9 mg/dL (ref 0.3–1.2)
Total Protein: 7.1 g/dL (ref 6.5–8.1)

## 2022-03-26 LAB — URINALYSIS, ROUTINE W REFLEX MICROSCOPIC
Bilirubin Urine: NEGATIVE
Glucose, UA: NEGATIVE mg/dL
Hgb urine dipstick: NEGATIVE
Ketones, ur: NEGATIVE mg/dL
Leukocytes,Ua: NEGATIVE
Nitrite: NEGATIVE
Protein, ur: NEGATIVE mg/dL
Specific Gravity, Urine: 1.023 (ref 1.005–1.030)
pH: 6 (ref 5.0–8.0)

## 2022-03-26 LAB — CBC
HCT: 42.3 % (ref 39.0–52.0)
Hemoglobin: 14 g/dL (ref 13.0–17.0)
MCH: 30.6 pg (ref 26.0–34.0)
MCHC: 33.1 g/dL (ref 30.0–36.0)
MCV: 92.4 fL (ref 80.0–100.0)
Platelets: 223 10*3/uL (ref 150–400)
RBC: 4.58 MIL/uL (ref 4.22–5.81)
RDW: 12.4 % (ref 11.5–15.5)
WBC: 7.9 10*3/uL (ref 4.0–10.5)
nRBC: 0 % (ref 0.0–0.2)

## 2022-03-26 LAB — LIPASE, BLOOD: Lipase: 30 U/L (ref 11–51)

## 2022-03-26 NOTE — ED Triage Notes (Signed)
Pt reports that he thinks he may have a hernia, pt points to his abd and reports that he has a bump there and the pain shoots down his right leg, pt reports he has no official diagnosis of hernia and states that he was here for eval the other day but was too busy here so he left without being seen ?

## 2022-03-26 NOTE — Discharge Instructions (Addendum)
-  Follow-up with the general surgeon listed above.  Schedule appointment utilizing the phone number listed above. ? ?-Please return to the emergency department anytime if you begin to experience any new or worsening symptoms. ? ?-You may treat pain with Tylenol/ibuprofen as needed. ?

## 2022-03-26 NOTE — ED Notes (Signed)
See triage note. Pt has small raised blue bruise-like area in medial mid abdomen; abdomen soft and not distended; pt c/o tenderness upon palpation and pain at L and medial abdomen and also c/o pain at R groin/thigh; pt denies N/V/D; pt denies changes to urination; denies fever; pt's skin dry; resp reg/unlabored; calm; in NAD; visitor at bedside.  ?

## 2022-03-26 NOTE — ED Notes (Signed)
Blood-work and urine sample taken by other staff during triage.  ?

## 2022-03-26 NOTE — ED Provider Notes (Signed)
[facility] Provider Note    Event Date/Time   First MD Initiated Contact with Patient 03/26/22 1240     (approximate)   History   Chief Complaint Abdominal Pain   HPI Lee Ruiz is a 41 y.o. male, history of anxiety, migraines, presents the emergency department for evaluation of abdominal pain.  Patient was doing heavy lifting approximately 2 weeks ago when he felt a bulge in his mid abdomen.  Since then, he states that he has been experiencing mild abdominal discomfort/pain, as well as a bulge sensation popping in and out in his abdomen.  He additionally states that there is intermittent pain shooting down to his right leg.  Denies fever/chills, nausea/vomiting, diarrhea, chest pain, shortness of breath, dysuria, bowel/bladder dysfunction, dizziness//lightheadedness, or rash/lesions.  Patient still eating/drinking appropriately  History Limitations: No limitations.        Physical Exam  Triage Vital Signs: ED Triage Vitals  Enc Vitals Group     BP 03/26/22 1153 123/88     Pulse Rate 03/26/22 1153 74     Resp 03/26/22 1153 16     Temp 03/26/22 1153 98.2 F (36.8 C)     Temp Source 03/26/22 1153 Oral     SpO2 03/26/22 1153 96 %     Weight 03/26/22 1154 140 lb (63.5 kg)     Height 03/26/22 1154 6' (1.829 m)     Head Circumference --      Peak Flow --      Pain Score 03/26/22 1154 0     Pain Loc --      Pain Edu? --      Excl. in GC? --     Most recent vital signs: Vitals:   03/26/22 1153  BP: 123/88  Pulse: 74  Resp: 16  Temp: 98.2 F (36.8 C)  SpO2: 96%    General: Awake, NAD.  Skin: Warm, dry. No rashes or lesions.  Eyes: PERRL. Conjunctivae normal.  CV: Good peripheral perfusion.  Resp: Normal effort.  Abd: Soft, non-tender. No distention.  Neuro: At baseline. No gross neurological deficits.   Focused Exam: Mild tenderness when palpating just superior to the periumbilical region.  No obvious masses.  Unable to palpate  hernia at this time.  Physical Exam    ED Results / Procedures / Treatments  Labs (all labs ordered are listed, but only abnormal results are displayed) Labs Reviewed  COMPREHENSIVE METABOLIC PANEL - Abnormal; Notable for the following components:      Result Value   Calcium 8.6 (*)    All other components within normal limits  URINALYSIS, ROUTINE W REFLEX MICROSCOPIC - Abnormal; Notable for the following components:   Color, Urine YELLOW (*)    APPearance CLEAR (*)    All other components within normal limits  LIPASE, BLOOD  CBC     EKG N/A.   RADIOLOGY  ED Provider Interpretation: N/A.  No results found.  PROCEDURES:  Critical Care performed: N/A.  Procedures    MEDICATIONS ORDERED IN ED: Medications - No data to display   IMPRESSION / MDM / ASSESSMENT AND PLAN / ED COURSE  I reviewed the triage vital signs and the nursing notes.                              Differential diagnosis includes, but is not limited to, paramedical hernia, inguinal hernia, gastroenteritis, appendicitis, pancreatitis  ED Course Patient appears  well, vitals within normal limits.  NAD.  Not requesting any pain or nausea medicine at this time.  CBC shows no leukocytosis or anemia.  CMP shows no evidence of significant electrolyte abnormalities, transaminitis, or evidence of kidney injury.  Lipase unremarkable at 30.  Unlikely pancreatitis  Urinalysis shows no evidence of infection.  Assessment/Plan Patient presents with upper/periumbilical abdominal pain that began approximately 2 weeks ago after lifting a heavy object.  History suggestive of possible hernia, though unable to definitively rule in or rule out from physical exam at this time.  Recommended CT imaging for the patient to further evaluate the etiology of his pain, however he states that he needs to leave now to pick up his daughter and does not have time for a CT scan.  His vitals and lab work-up are reassuring.   Unlikely any serious or life-threatening pathology.  Will plan to discharge him with a general surgery referral, however did advise him that there are inherent risks to deferring imaging at this time.  He states he is willing to incur these risks.   Provided the patient with anticipatory guidance, return precautions, and educational material. Encouraged the patient to return to the emergency department at any time if they begin to experience any new or worsening symptoms. Patient expressed understanding and agreed with the plan.       FINAL CLINICAL IMPRESSION(S) / ED DIAGNOSES   Final diagnoses:  Generalized abdominal pain     Rx / DC Orders   ED Discharge Orders     None        Note:  This document was prepared using Dragon voice recognition software and may include unintentional dictation errors.   Teodoro Spray, [place] 03/26/22 1555    [place], MD 03/27/22 1031

## 2022-05-15 ENCOUNTER — Other Ambulatory Visit: Payer: Self-pay

## 2022-05-15 ENCOUNTER — Emergency Department
Admission: EM | Admit: 2022-05-15 | Discharge: 2022-05-15 | Disposition: A | Discharge status: Home / Self Care | Payer: BC Managed Care – PPO | Attending: Emergency Medicine | Admitting: Emergency Medicine

## 2022-05-15 ENCOUNTER — Encounter: Payer: Self-pay | Admitting: Emergency Medicine

## 2022-05-15 DIAGNOSIS — M6208 Separation of muscle (nontraumatic), other site: Secondary | ICD-10-CM | POA: Diagnosis not present

## 2022-05-15 DIAGNOSIS — K439 Ventral hernia without obstruction or gangrene: Secondary | ICD-10-CM | POA: Insufficient documentation

## 2022-05-15 DIAGNOSIS — R109 Unspecified abdominal pain: Secondary | ICD-10-CM | POA: Diagnosis present

## 2022-05-15 LAB — COMPREHENSIVE METABOLIC PANEL
ALT: 24 U/L (ref 0–44)
AST: 23 U/L (ref 15–41)
Albumin: 4.3 g/dL (ref 3.5–5.0)
Alkaline Phosphatase: 49 U/L (ref 38–126)
Anion gap: 7 (ref 5–15)
BUN: 10 mg/dL (ref 6–20)
CO2: 28 mmol/L (ref 22–32)
Calcium: 9.4 mg/dL (ref 8.9–10.3)
Chloride: 103 mmol/L (ref 98–111)
Creatinine, Ser: 0.79 mg/dL (ref 0.61–1.24)
GFR, Estimated: >60 mL/min (ref >60)
Glucose, Bld: 100 mg/dL — ABNORMAL HIGH (ref 70–99)
Potassium: 4.1 mmol/L (ref 3.5–5.1)
Sodium: 138 mmol/L (ref 135–145)
Total Bilirubin: 1.3 mg/dL — ABNORMAL HIGH (ref 0.3–1.2)
Total Protein: 7.8 g/dL (ref 6.5–8.1)

## 2022-05-15 LAB — CBC
HCT: 45.2 % (ref 39.0–52.0)
Hemoglobin: 15 g/dL (ref 13.0–17.0)
MCH: 30.6 pg (ref 26.0–34.0)
MCHC: 33.2 g/dL (ref 30.0–36.0)
MCV: 92.2 fL (ref 80.0–100.0)
Platelets: 269 10*3/uL (ref 150–400)
RBC: 4.9 MIL/uL (ref 4.22–5.81)
RDW: 12.8 % (ref 11.5–15.5)
WBC: 10.4 10*3/uL (ref 4.0–10.5)
nRBC: 0 % (ref 0.0–0.2)

## 2022-05-15 MED ORDER — HYDROCODONE-ACETAMINOPHEN 5-325 MG PO TABS: 1.0000 | ORAL_TABLET | Freq: Three times a day (TID) | ORAL | 0 refills | Status: AC | PRN

## 2022-05-15 MED ORDER — ONDANSETRON 4 MG PO TBDP: 4.0000 mg | ORAL_TABLET | Freq: Three times a day (TID) | ORAL | 0 refills | Status: DC | PRN

## 2022-05-15 NOTE — Discharge Instructions (Addendum)
Your exam and labs are reassuring. You have evidence of a rectus muscle defect. You can follow-up with Dr. Aleen Campi for surgical consultation.

## 2022-05-15 NOTE — ED Triage Notes (Signed)
Triaged on paper  here for abd pain  possible hernia

## 2022-05-15 NOTE — ED Provider Notes (Signed)
[facility] Emergency Department Provider Note     Event Date/Time   First MD Initiated Contact with Patient 05/15/22 1557     (approximate)   History   Abdominal Pain   HPI  Lee Ruiz is a 41 y.o. male with a medical history of anxiety and migraines, presents to the ED for evaluation of possible hernia.  Patient denies any fevers, chills, or sweats.  Patient denies any nausea, vomiting, or diarrhea.  He presents for evaluation of a ventral hernia with some mild tenderness over the last day or 2 after some heavy lifting.  Patient denies any difficulty with bowel movements, denies any nausea, vomiting, or chest pain.  He also denies any fever, chills, sweats, skin changes, or purulent drainage.  He presents knowing that he needs a surgical referral for elective management.  Patient was seen here for the same complaint a month and a half earlier but declined CT imaging at that time.  He presents today in no acute distress noting some mild tenderness to palpation over the ventral defect.     Physical Exam   Triage Vital Signs: ED Triage Vitals  Enc Vitals Group     BP 05/15/22 1411 (!) 123/92     Pulse Rate 05/15/22 1411 86     Resp 05/15/22 1411 18     Temp 05/15/22 1411 98.3 F (36.8 C)     Temp Source 05/15/22 1411 Oral     SpO2 05/15/22 1411 99 %     Weight 05/15/22 1415 140 lb (63.5 kg)     Height 05/15/22 1415 6' (1.829 m)     Head Circumference --      Peak Flow --      Pain Score 05/15/22 1412 5     Pain Loc --      Pain Edu? --      Excl. in GC? --     Most recent vital signs: Vitals:   05/15/22 1411 05/15/22 1708  BP: (!) 123/92 120/88  Pulse: 86 80  Resp: 18 18  Temp: 98.3 F (36.8 C)   SpO2: 99% 99%    General Awake, no distress.  CV:  Good peripheral perfusion.  RESP:  Normal effort.  ABD:  No distention.  Soft and nontender.  Patient with a small ventral defect superior to the umbilicus consistent with a probable  rectus muscle diastases and small fat-containing hernia.  No significant bulge, no active bowel sounds, no dependence of reducible bowel containing hernia.   ED Results / Procedures / Treatments   Labs (all labs ordered are listed, but only abnormal results are displayed) Labs Reviewed  COMPREHENSIVE METABOLIC PANEL - Abnormal; Notable for the following components:      Result Value   Glucose, Bld 100 (*)    Total Bilirubin 1.3 (*)    All other components within normal limits  CBC     EKG   RADIOLOGY   No results found.   PROCEDURES:  Critical Care performed: No  Procedures   MEDICATIONS ORDERED IN ED: Medications - No data to display   IMPRESSION / MDM / ASSESSMENT AND PLAN / ED COURSE  I reviewed the triage vital signs and the nursing notes.                              Differential diagnosis includes, but is not limited to, umbilical hernia, ventral hernia, diastases  rectus muscle defect  Patient's presentation is most consistent with acute, uncomplicated illness.  Patient returning to the ED for evaluation of a previously diagnosed diastases rectus muscle defect concerning for small ventral hernia.  Patient's diagnosis is consistent with the same.  No acute findings on exam as patient's labs are reassuring without signs of acute infectious process or leukocytosis.  No clinical evidence of any acute bowel incarceration or local cellulitis.  Patient will be discharged home with prescriptions for hydrocodone. Patient is to follow up with general surgery for surgical consultation as needed or otherwise directed. Patient is given ED precautions to return to the ED for any worsening or new symptoms.     FINAL CLINICAL IMPRESSION(S) / ED DIAGNOSES   Final diagnoses:  Rectus diastasis     Rx / DC Orders   ED Discharge Orders          Ordered    HYDROcodone-acetaminophen (NORCO) 5-325 MG tablet  3 times daily PRN        05/15/22 1646    ondansetron  (ZOFRAN-ODT) 4 MG disintegrating tablet  Every 8 hours PRN        05/15/22 1646             Note:  This document was prepared using Dragon voice recognition software and may include unintentional dictation errors.    Lissa Hoard, PA-C 05/17/22 2303    Gilles Chiquito, MD 05/18/22 1343

## 2022-06-04 ENCOUNTER — Ambulatory Visit: Payer: BC Managed Care – PPO | Admitting: Surgery

## 2023-05-24 ENCOUNTER — Emergency Department
Admission: EM | Admit: 2023-05-24 | Discharge: 2023-05-25 | Disposition: A | Discharge status: Home / Self Care | Payer: BC Managed Care – PPO | Source: Home / Self Care | Attending: Emergency Medicine | Admitting: Emergency Medicine

## 2023-05-24 DIAGNOSIS — B349 Viral infection, unspecified: Secondary | ICD-10-CM | POA: Diagnosis not present

## 2023-05-24 DIAGNOSIS — U071 COVID-19: Secondary | ICD-10-CM | POA: Diagnosis not present

## 2023-05-24 DIAGNOSIS — R059 Cough, unspecified: Secondary | ICD-10-CM | POA: Diagnosis present

## 2023-05-24 HISTORY — DX: Dizziness and giddiness: R42

## 2023-05-24 LAB — COMPREHENSIVE METABOLIC PANEL
ALT: 44 U/L (ref 0–44)
AST: 48 U/L — ABNORMAL HIGH (ref 15–41)
Albumin: 4.5 g/dL (ref 3.5–5.0)
Alkaline Phosphatase: 39 U/L (ref 38–126)
Anion gap: 10 (ref 5–15)
BUN: 7 mg/dL (ref 6–20)
CO2: 20 mmol/L — ABNORMAL LOW (ref 22–32)
Calcium: 9 mg/dL (ref 8.9–10.3)
Chloride: 102 mmol/L (ref 98–111)
Creatinine, Ser: 0.86 mg/dL (ref 0.61–1.24)
GFR, Estimated: >60 mL/min (ref >60)
Glucose, Bld: 111 mg/dL — ABNORMAL HIGH (ref 70–99)
Potassium: 2.9 mmol/L — ABNORMAL LOW (ref 3.5–5.1)
Sodium: 132 mmol/L — ABNORMAL LOW (ref 135–145)
Total Bilirubin: 1.2 mg/dL (ref 0.3–1.2)
Total Protein: 7.5 g/dL (ref 6.5–8.1)

## 2023-05-24 LAB — CBC
HCT: 41.6 % (ref 39.0–52.0)
Hemoglobin: 14.2 g/dL (ref 13.0–17.0)
MCH: 31.4 pg (ref 26.0–34.0)
MCHC: 34.1 g/dL (ref 30.0–36.0)
MCV: 92 fL (ref 80.0–100.0)
Platelets: 133 10*3/uL — ABNORMAL LOW (ref 150–400)
RBC: 4.52 MIL/uL (ref 4.22–5.81)
RDW: 12.1 % (ref 11.5–15.5)
WBC: 4.2 10*3/uL (ref 4.0–10.5)
nRBC: 0 % (ref 0.0–0.2)

## 2023-05-24 LAB — RESP PANEL BY RT-PCR (RSV, FLU A&B, COVID)  RVPGX2
Influenza A by PCR: NEGATIVE
Influenza B by PCR: NEGATIVE
Resp Syncytial Virus by PCR: NEGATIVE
SARS Coronavirus 2 by RT PCR: POSITIVE — AB

## 2023-05-24 LAB — URINALYSIS, ROUTINE W REFLEX MICROSCOPIC
Bilirubin Urine: NEGATIVE
Glucose, UA: NEGATIVE mg/dL
Hgb urine dipstick: NEGATIVE
Ketones, ur: NEGATIVE mg/dL
Leukocytes,Ua: NEGATIVE
Nitrite: NEGATIVE
Protein, ur: NEGATIVE mg/dL
Specific Gravity, Urine: 1.011 (ref 1.005–1.030)
pH: 5 (ref 5.0–8.0)

## 2023-05-24 LAB — LIPASE, BLOOD: Lipase: 48 U/L (ref 11–51)

## 2023-05-24 MED ORDER — POTASSIUM CHLORIDE CRYS ER 20 MEQ PO TBCR
40.0000 meq | EXTENDED_RELEASE_TABLET | Freq: Once | ORAL | Status: AC
  Administered 2023-05-25: 40 meq via ORAL
  Filled 2023-05-24: qty 2

## 2023-05-24 MED ORDER — IBUPROFEN 600 MG PO TABS
600.0000 mg | ORAL_TABLET | Freq: Once | ORAL | Status: AC
  Administered 2023-05-25: 600 mg via ORAL
  Filled 2023-05-24: qty 1

## 2023-05-24 NOTE — ED Triage Notes (Addendum)
Pt arrived POV for multiple complaints, pt reports lower back pain that radiates into his lower abd, neck pain (reports bulging disc), n/v,d, intermittent fever, dry unproductive cough, decrease appetite, and intermittent SOB. Pt reports Tylenol PTA. VSS, A&O x4. Pt drinking a can or Coke in triage, reports last mariguana use was yesterday.

## 2023-05-25 ENCOUNTER — Emergency Department: Payer: BC Managed Care – PPO

## 2023-05-25 MED ORDER — NIRMATRELVIR/RITONAVIR (PAXLOVID)TABLET: 3.0000 | ORAL_TABLET | Freq: Two times a day (BID) | ORAL | 0 refills | Status: AC

## 2023-05-25 MED ORDER — POTASSIUM CHLORIDE CRYS ER 20 MEQ PO TBCR: 20.0000 meq | EXTENDED_RELEASE_TABLET | Freq: Every day | ORAL | 0 refills | Status: DC

## 2023-05-25 MED ORDER — ONDANSETRON 4 MG PO TBDP: ORAL_TABLET | ORAL | 0 refills | Status: AC

## 2023-05-25 NOTE — ED Provider Notes (Signed)
[facility] Provider Note    Event Date/Time   First MD Initiated Contact with Patient 05/24/23 2326     (approximate)   History   Multiple Complaints    HPI Lee Ruiz is a 42 y.o. male who presents for evaluation of a variety of complaints.  He reports that his whole body hurts, he has a dry cough, he has had a fever, low back pain, neck pain with a history of bulging disc, nausea/vomiting/diarrhea, decreased appetite, and intermittent shortness of breath.  Patient reports that the symptoms started yesterday.  He states that he has never had the COVID vaccination and that he went to an event for July 4 where he did not wear a mask and that was the first time.  He has never had COVID before.  He has no numbness nor weakness in his extremities.  He smoked marijuana yesterday.     Physical Exam   Triage Vital Signs: ED Triage Vitals  Encounter Vitals Group     BP 05/24/23 2043 (!) 134/92     Systolic BP Percentile --      Diastolic BP Percentile --      Pulse Rate 05/24/23 2043 89     Resp 05/24/23 2043 18     Temp 05/24/23 2043 100 F (37.8 C)     Temp Source 05/24/23 2043 Oral     SpO2 05/24/23 2043 99 %     Weight 05/24/23 2044 63.5 kg (140 lb)     Height 05/24/23 2044 1.829 m (6')     Head Circumference --      Peak Flow --      Pain Score 05/24/23 2044 8     Pain Loc --      Pain Education --      Exclude from Growth Chart --     Most recent vital signs: Vitals:   05/25/23 0001 05/25/23 0118  BP: 125/86 122/84  Pulse: 75 78  Resp: 16 20  Temp:    SpO2: 100% 100%    General: Awake, no distress.  Generally well-appearing. CV:  Good peripheral perfusion.  Normal heart sounds.  Regular rate and rhythm. Resp:  Normal effort. Speaking easily and comfortably, no accessory muscle usage nor intercostal retractions.  Lungs clear to auscultation.  No wheezes, rales, nor rhonchi. Abd:  No distention.  MSK:  Patient reports tenderness  to palpation along the cervical spine and upper thoracic spine, but he also reports tenderness to the paraspinal muscles.  He says he has a prominent bulge at the bottom of his cervical spine but that it has always been here.  There is no evidence of abscess or infection.  No indication for imaging.   ED Results / Procedures / Treatments   Labs (all labs ordered are listed, but only abnormal results are displayed) Labs Reviewed  RESP PANEL BY RT-PCR (RSV, FLU A&B, COVID)  RVPGX2 - Abnormal; Notable for the following components:      Result Value   SARS Coronavirus 2 by RT PCR POSITIVE (*)    All other components within normal limits  COMPREHENSIVE METABOLIC PANEL - Abnormal; Notable for the following components:   Sodium 132 (*)    Potassium 2.9 (*)    CO2 20 (*)    Glucose, Bld 111 (*)    AST 48 (*)    All other components within normal limits  CBC - Abnormal; Notable for the following components:   Platelets 133 (*)  All other components within normal limits  URINALYSIS, ROUTINE W REFLEX MICROSCOPIC - Abnormal; Notable for the following components:   Color, Urine YELLOW (*)    APPearance CLEAR (*)    All other components within normal limits  LIPASE, BLOOD     RADIOLOGY I viewed and interpreted the patient's two-view chest x-ray I see no evidence of consolidation, pneumothorax, or other concerning abnormality.   PROCEDURES:  Critical Care performed: No  Procedures    IMPRESSION / MDM / ASSESSMENT AND PLAN / ED COURSE  I reviewed the triage vital signs and the nursing notes.                              Differential diagnosis includes, but is not limited to, COVID-19 or other respiratory virus, community-acquired pneumonia, new onset CHF, pneumothorax, UTI, other bacterial illness.  Patient's presentation is most consistent with acute presentation with potential threat to life or bodily function.  Labs/studies ordered: 2 view chest x-ray, CBC, lipase,  comprehensive metabolic panel, respiratory viral panel, urinalysis  Interventions/Medications given:  Medications  ibuprofen (ADVIL) tablet 600 mg (600 mg Oral Given 05/25/23 0019)  potassium chloride SA (KLOR-CON M) CR tablet 40 mEq (40 mEq Oral Given 05/25/23 0019)    (Note:  hospital course my include additional interventions and/or labs/studies not listed above.)   Vital signs notable for an elevated temperature that does not quite count is a fever.  Patient complaining of multiple symptoms all consistent with viral illness.  He is unvaccinated and tested positive tonight for COVID-19.  Reassuring physical exam and chest x-ray.  Mild hypokalemia which I addressed with potassium supplement.  Also gave ibuprofen 600 mg.  I gave the patient my usual and customary COVID-19 discussion.  I advised fluids, prescription for potassium, prescription for nausea medicine, isolation, etc.  Patient and family member understand and agree with the plan.  Given that patient has only had symptoms for about 1 day, I offered paxlovid .  He would like to try conservative or prescription.  He has normal renal function.  He is well-appearing and in no distress, laughing and joking with me, appropriate for discharge and outpatient follow-up.  I gave my usual return precautions.       FINAL CLINICAL IMPRESSION(S) / ED DIAGNOSES   Final diagnoses:  COVID-19  Acute viral syndrome     Rx / DC Orders   ED Discharge Orders          Ordered    Ambulatory Referral to Primary Care (Establish Care)        05/25/23 0107    nirmatrelvir/ritonavir (PAXLOVID) 20 x 150 MG & 10 x 100MG  TABS  2 times daily        05/25/23 0109    ondansetron (ZOFRAN-ODT) 4 MG disintegrating tablet        05/25/23 0109    potassium chloride SA (KLOR-CON M20) 20 MEQ tablet  Daily        05/25/23 0109             Note:  This document was prepared using Dragon voice recognition software and may include unintentional dictation  errors.   Loleta Rose, MD 05/25/23 [phone]

## 2023-05-25 NOTE — ED Notes (Signed)
Pt back from x-ray.

## 2023-08-29 ENCOUNTER — Encounter: Payer: Self-pay | Admitting: Emergency Medicine

## 2023-08-29 ENCOUNTER — Emergency Department
Admission: EM | Admit: 2023-08-29 | Discharge: 2023-08-30 | Disposition: A | Discharge status: Home / Self Care | Payer: BC Managed Care – PPO | Source: Home / Self Care | Attending: Emergency Medicine | Admitting: Emergency Medicine

## 2023-08-29 ENCOUNTER — Emergency Department
Admission: EM | Admit: 2023-08-29 | Discharge: 2023-08-29 | Disposition: A | Discharge status: Home / Self Care | Payer: BC Managed Care – PPO | Attending: Emergency Medicine | Admitting: Emergency Medicine

## 2023-08-29 ENCOUNTER — Emergency Department: Payer: BC Managed Care – PPO

## 2023-08-29 ENCOUNTER — Other Ambulatory Visit: Payer: Self-pay

## 2023-08-29 DIAGNOSIS — S61211A Laceration without foreign body of left index finger without damage to nail, initial encounter: Secondary | ICD-10-CM

## 2023-08-29 DIAGNOSIS — W232XXA Caught, crushed, jammed or pinched between a moving and stationary object, initial encounter: Secondary | ICD-10-CM | POA: Insufficient documentation

## 2023-08-29 DIAGNOSIS — F17210 Nicotine dependence, cigarettes, uncomplicated: Secondary | ICD-10-CM | POA: Diagnosis not present

## 2023-08-29 DIAGNOSIS — K047 Periapical abscess without sinus: Secondary | ICD-10-CM | POA: Insufficient documentation

## 2023-08-29 DIAGNOSIS — K0889 Other specified disorders of teeth and supporting structures: Secondary | ICD-10-CM | POA: Diagnosis present

## 2023-08-29 DIAGNOSIS — W230XXA Caught, crushed, jammed, or pinched between moving objects, initial encounter: Secondary | ICD-10-CM | POA: Insufficient documentation

## 2023-08-29 MED ORDER — CLINDAMYCIN HCL 300 MG PO CAPS: 300.0000 mg | ORAL_CAPSULE | Freq: Three times a day (TID) | ORAL | 0 refills | Status: AC

## 2023-08-29 MED ORDER — LIDOCAINE HCL (PF) 1 % IJ SOLN
5.0000 mL | Freq: Once | INTRAMUSCULAR | Status: AC
  Administered 2023-08-29: 5 mL
  Filled 2023-08-29: qty 5

## 2023-08-29 MED ORDER — OXYCODONE HCL 5 MG PO TABS: 5.0000 mg | ORAL_TABLET | Freq: Four times a day (QID) | ORAL | 0 refills | Status: DC | PRN

## 2023-08-29 NOTE — ED Provider Notes (Signed)
[facility] Provider Note    Event Date/Time   First MD Initiated Contact with Patient 08/29/23 2332     (approximate)   History   Finger Injury   HPI Lee Ruiz is a 42 y.o. male who presents for evaluation of injury to his left index finger.  He reports that he slammed the tip of his finger in a car door.  There is a laceration with bleeding that is controlled with direct pressure.  No other injuries reported.  Reports that the pain is severe.     Physical Exam   Triage Vital Signs: ED Triage Vitals  Encounter Vitals Group     BP 08/29/23 1924 (!) 111/92     Systolic BP Percentile --      Diastolic BP Percentile --      Pulse Rate 08/29/23 1924 (!) 104     Resp 08/29/23 1924 20     Temp 08/29/23 1924 98.7 F (37.1 C)     Temp Source 08/29/23 1924 Oral     SpO2 08/29/23 1924 95 %     Weight 08/29/23 1929 65.8 kg (145 lb)     Height --      Head Circumference --      Peak Flow --      Pain Score 08/29/23 1929 10     Pain Loc --      Pain Education --      Exclude from Growth Chart --     Most recent vital signs: Vitals:   08/29/23 1924  BP: (!) 111/92  Pulse: (!) 104  Resp: 20  Temp: 98.7 F (37.1 C)  SpO2: 95%    General: Awake, no distress.  CV:  Good peripheral perfusion.  Resp:  Normal effort. Speaking easily and comfortably, no accessory muscle usage nor intercostal retractions.   Abd:  No distention.  Other:  Laceration to the proximal aspect of the distal phalanx of the left index finger.  Slow oozing bleeding from the wound once the gauze is removed.  Normal range of motion of the finger.  No injury to the fingernail.   ED Results / Procedures / Treatments   Labs (all labs ordered are listed, but only abnormal results are displayed) Labs Reviewed - No data to display   RADIOLOGY I viewed and interpreted the patient's finger x-rays and there is no evidence of fracture.   PROCEDURES:  Critical Care  performed: No  Procedures    IMPRESSION / MDM / ASSESSMENT AND PLAN / ED COURSE  I reviewed the triage vital signs and the nursing notes.                              Differential diagnosis includes, but is not limited to, laceration, fracture.  Patient's presentation is most consistent with acute complicated illness / injury requiring diagnostic workup.  Labs/studies ordered: Finger x-rays  Interventions/Medications given:  Medications  lidocaine (PF) (XYLOCAINE) 1 % injection 5 mL (has no administration in time range)    (Note:  hospital course my include additional interventions and/or labs/studies not listed above.)   No bony involvement, repaired laceration as described above.  No indication for antibiotics.  Patient is up-to-date within the last 5 years on his Tdap.  Gave usual follow-up recommendations and return precautions.         FINAL CLINICAL IMPRESSION(S) / ED DIAGNOSES   Final diagnoses:  None  Rx / DC Orders   ED Discharge Orders     None        Note:  This document was prepared using Dragon voice recognition software and may include unintentional dictation errors.

## 2023-08-29 NOTE — Discharge Instructions (Signed)
Take ibuprofen and tylenol as needed for pain.  Take all the antibiotic until completely finished.  A prescription for oxycodone was sent to the pharmacy for you to take every 6 hours if needed for severe pain.  Be aware that she cannot drive or operate machinery while taking that medication.  Also a list of dental clinics is provided if there is a oral surgeon available that can do it for less money.  OPTIONS FOR DENTAL FOLLOW UP CARE  [place] [facility] - Local Safety Net Dental Clinics [email or web address]   [facility] [phone])  [facility] [phone])  [place] [phone])  [facility] [phone])  [facility] [phone]) This clinic caters to the indigent population and is on a lottery system. Location: [employer] [facility], [employer], [address] [place] Clinic Hours: Wednesdays from 6pm - 9pm, patients seen by a lottery system. For dates, call or go to [email or web address] Services: Cleanings, fillings and simple extractions. Payment Options: DENTAL WORK IS FREE OF CHARGE. Bring proof of income or support. Best way to get seen: Arrive at 5:15 pm - this is a lottery, NOT first come/first serve, so arriving earlier will not increase your chances of being seen.     [facility] [phone] Select option 1 for emergencies   Location: [facility], [place], [address] [place] Clinic Hours: No walk-ins accepted - call the day before to schedule an appointment. Check in times are 9:30 am and 1:30 pm. Services: Simple extractions, temporary fillings, pulpectomy/pulp debridement, uncomplicated abscess drainage. Payment Options: PAYMENT IS DUE AT THE TIME OF SERVICE.  Fee is usually $100-200, additional surgical procedures (e.g.  abscess drainage) may be extra. Cash, checks, Visa/MasterCard accepted.  Can file Medicaid if patient is covered for dental - patient should call case worker to check. No discount for [facility] patients. Best way to get seen: MUST call the day before and get onto the schedule. Can usually be seen the next 1-2 days. No walk-ins accepted.     [facility] [phone]   Location: [facility], [address] [place] Clinic Hours: M, W, Th, F 8am or 1:30pm, Tues 9a or 1:30 - first come/first served. Services: Simple extractions, temporary fillings, uncomplicated abscess drainage.  You do not need to be an [facility] resident. Payment Options: PAYMENT IS DUE AT THE TIME OF SERVICE. Dental insurance, otherwise sliding scale - bring proof of income or support. Depending on income and treatment needed, cost is usually $50-200. Best way to get seen: Arrive early as it is first come/first served.     [facility] [place] [facility] [phone]   Location: [address] Clinic Hours: Mon-Thu 8a-5p Services: Most basic dental services including extractions and fillings. Payment Options: PAYMENT IS DUE AT THE TIME OF SERVICE. Sliding scale, up to 50% off - bring proof if income or support. Medicaid with dental option accepted. Best way to get seen: Call to schedule an appointment, can usually be seen within 2 weeks OR they will try to see walk-ins - show up at 8a or 2p (you may have to wait).     [facility] [phone] [place] RESIDENTS ONLY   Location: [facility], [address], [place], [place] [address] Clinic Hours: By appointment only. Monday - Thursday 8am-5pm, Friday 8am-12pm Services: Cleanings, fillings, extractions. Payment Options:  PAYMENT IS DUE AT THE TIME OF SERVICE. Cash, Visa or MasterCard. Sliding scale - $30 minimum per service. Best way to get  seen: Come in to office, complete packet and make an appointment - need proof of income or support monies for each household member and proof of [facility] residence. Usually takes about a month to get in.     [facility] [phone]   Location: [address], [facility] Clinic Hours: Walk-in Urgent Care Dental Services are offered Monday-Friday mornings only. The numbers of emergencies accepted daily is limited to the number of providers available. Maximum 15 - Mondays, Wednesdays & Thursdays Maximum 10 - Tuesdays & Fridays Services: You do not need to be a [facility] resident to be seen for a dental emergency. Emergencies are defined as pain, swelling, abnormal bleeding, or dental trauma. Walkins will receive x-rays if needed. NOTE: Dental cleaning is not an emergency. Payment Options: PAYMENT IS DUE AT THE TIME OF SERVICE. Minimum co-pay is $40.00 for uninsured patients. Minimum co-pay is $3.00 for Medicaid with dental coverage. Dental Insurance is accepted and must be presented at time of visit. Medicare does not cover dental. Forms of payment: Cash, credit card, checks. Best way to get seen: If not previously registered with the clinic, walk-in dental registration begins at 7:15 am and is on a first come/first serve basis. If previously registered with the clinic, call to make an appointment.     [facility] [phone] [place] RESIDENTS ONLY   Location: [address] [place], [place] Clinic Hours: Mon-Thu 10a-2p Services: Extractions only! Payment Options: FREE (donations accepted) - bring proof of income or support Best way to get seen: Call and schedule an appointment OR come at 8am on the 1st Monday of every month (except for holidays) when it is first come/first served.     [facility] [phone]   Location: [address] [place], [place] Clinic Hours: Friday mornings Services, Payment Options, Best  way to get seen: Call for info

## 2023-08-29 NOTE — Discharge Instructions (Signed)
You have been seen in the Emergency Department (ED) today for a laceration (cut).  Please keep the cut clean but do not submerge it in the water.  It has been repaired with staples or sutures that will need to be removed in about 7-10 days. Please follow up with your doctor, an urgent care, or return to the ED for suture removal.    Please take Tylenol (acetaminophen) or Motrin (ibuprofen) as needed for discomfort as written on the box.   Please follow up with your doctor as soon as possible regarding today's emergent visit.   Return to the ED or call your doctor if you notice any signs of infection such as fever, increased pain, increased redness, pus, or other symptoms that concern you.

## 2023-08-29 NOTE — ED Provider Notes (Signed)
[facility] Provider Note    Event Date/Time   First MD Initiated Contact with Patient 08/29/23 [phone]     (approximate)   History   Dental Pain   HPI  Lee Ruiz is a 42 y.o. male presents to the ED with complaint of dental pain.  Patient has had dental problems for several years and has been trying to have something done.  He states that he saw an Transport planner in [place] who quoted him a price of $8000.  Patient has a history of anxiety and continues to smoke 1 pack cigarettes per day.     Physical Exam   Triage Vital Signs: ED Triage Vitals  Encounter Vitals Group     BP 08/29/23 0912 (!) 128/95     Systolic BP Percentile --      Diastolic BP Percentile --      Pulse Rate 08/29/23 0912 79     Resp 08/29/23 0912 20     Temp 08/29/23 0912 (!) 97.5 F (36.4 C)     Temp Source 08/29/23 0912 Oral     SpO2 08/29/23 0912 98 %     Weight 08/29/23 0911 145 lb (65.8 kg)     Height 08/29/23 0911 6' (1.829 m)     Head Circumference --      Peak Flow --      Pain Score 08/29/23 0911 9     Pain Loc --      Pain Education --      Exclude from Growth Chart --     Most recent vital signs: Vitals:   08/29/23 0912  BP: (!) 128/95  Pulse: 79  Resp: 20  Temp: (!) 97.5 F (36.4 C)  SpO2: 98%     General: Awake, no distress.  Mild right lower facial edema noted. CV:  Good peripheral perfusion.  Resp:  Normal effort.  Abd:  No distention.  Other:  Right lower posterior molars are in very poor repair and hygiene.  Gums are edematous with multiple molars below the gumline.  No obvious abscess or drainage present.  Area is moderately tender to palpation.   ED Results / Procedures / Treatments   Labs (all labs ordered are listed, but only abnormal results are displayed) Labs Reviewed - No data to display    PROCEDURES:  Critical Care performed:   Procedures   MEDICATIONS ORDERED IN ED: Medications - No data to  display   IMPRESSION / MDM / ASSESSMENT AND PLAN / ED COURSE  I reviewed the triage vital signs and the nursing notes.   Differential diagnosis includes, but is not limited to, dental pain, dental abscess, gingivitis.  42 year old male presents to the ED with complaint of right lower mandibular pain due to multiple dental caries and gum edema.  Patient has seen an oral surgeon in [place] and states he cannot afford the price quoted to him.  A list of dental options was given to him.  A prescription for clindamycin was sent to the pharmacy for him to begin taking.  Prescription for oxycodone 5 mg #5 also sent to the pharmacy.  Patient is instructed to take Tylenol or ibuprofen for dental pain and is aware that he cannot take the pain medication while driving or operating machinery.  List of dental clinics was provided to him.      Patient's presentation is most consistent with acute, uncomplicated illness.  FINAL CLINICAL IMPRESSION(S) / ED DIAGNOSES   Final diagnoses:  Dental abscess     Rx / DC Orders   ED Discharge Orders          Ordered    clindamycin (CLEOCIN) 300 MG capsule  3 times daily        08/29/23 0945    oxyCODONE (OXY IR/ROXICODONE) 5 MG immediate release tablet  Every 6 hours PRN        08/29/23 0945             Note:  This document was prepared using Dragon voice recognition software and may include unintentional dictation errors.   Tommi Rumps, PA-C 08/29/23 1108    Jene Every, MD 08/29/23 [phone]

## 2023-08-29 NOTE — ED Triage Notes (Signed)
Pt via POV from home. Pt c/o R lower dental pain states it started hurting 2 days ago but then started swelling this morning. Swelling noted to R lower jaw. Pt is A&Ox4 and NAD, ambulatory to triage.

## 2023-08-29 NOTE — ED Triage Notes (Signed)
Pt in with laceration to distal L index finger after slamming his finger in car door. Limited ROM, fingertip pink and warm, bleeding noted, new gauze and coban dressing placed in triage.

## 2023-08-30 NOTE — ED Notes (Signed)
Discharge instructions reviewed and wound care provided. Supplies to maintain wound care provided.   Encouraged to follow up 7-10 days for suture removal.   Opportunity for questions and concerns provided.   Displays no signs of distress prior to departure.

## 2023-12-06 ENCOUNTER — Other Ambulatory Visit: Payer: Self-pay

## 2023-12-06 ENCOUNTER — Encounter: Payer: Self-pay | Admitting: Emergency Medicine

## 2023-12-06 ENCOUNTER — Emergency Department
Admission: EM | Admit: 2023-12-06 | Discharge: 2023-12-06 | Disposition: A | Discharge status: Home / Self Care | Payer: BC Managed Care – PPO | Attending: Emergency Medicine | Admitting: Emergency Medicine

## 2023-12-06 DIAGNOSIS — K0889 Other specified disorders of teeth and supporting structures: Secondary | ICD-10-CM | POA: Diagnosis present

## 2023-12-06 DIAGNOSIS — K047 Periapical abscess without sinus: Secondary | ICD-10-CM | POA: Diagnosis not present

## 2023-12-06 MED ORDER — OXYCODONE-ACETAMINOPHEN 5-325 MG PO TABS: 1.0000 | ORAL_TABLET | ORAL | 0 refills | Status: AC | PRN

## 2023-12-06 MED ORDER — FENTANYL CITRATE PF 50 MCG/ML IJ SOSY
50.0000 ug | PREFILLED_SYRINGE | Freq: Once | INTRAMUSCULAR | Status: AC
  Administered 2023-12-06: 50 ug via INTRAMUSCULAR
  Filled 2023-12-06: qty 1

## 2023-12-06 MED ORDER — LIDOCAINE-EPINEPHRINE 2 %-1:100000 IJ SOLN
1.7000 mL | Freq: Once | INTRAMUSCULAR | Status: AC
  Administered 2023-12-06: 1.7 mL via INTRADERMAL
  Filled 2023-12-06: qty 1.7

## 2023-12-06 MED ORDER — LIDOCAINE-EPINEPHRINE-TETRACAINE (LET) TOPICAL GEL
3.0000 mL | Freq: Once | TOPICAL | Status: AC
  Administered 2023-12-06: 3 mL via TOPICAL
  Filled 2023-12-06: qty 3

## 2023-12-06 MED ORDER — CLINDAMYCIN HCL 150 MG PO CAPS: 300.0000 mg | ORAL_CAPSULE | Freq: Three times a day (TID) | ORAL | 0 refills | Status: AC

## 2023-12-06 NOTE — ED Provider Notes (Signed)
[facility] Provider Note    Event Date/Time   First MD Initiated Contact with Patient 12/06/23 0720     (approximate)   History   Dental Pain   HPI  Lee Ruiz is a 43 y.o. male with history of anxiety and vertigo presents emergency department with a dental abscess.  States he has had a lot of trouble with his teeth and has seen an Transport planner.  Patient is trying to save money up to have his teeth removed by the oral surgeon.  States did few days ago, worsened over the last 3 days.  No fever or chills.  Just a lot of swelling and pain.      Physical Exam   Triage Vital Signs: ED Triage Vitals [12/06/23 0707]  Encounter Vitals Group     BP (!) 142/106     Systolic BP Percentile      Diastolic BP Percentile      Pulse Rate 97     Resp 18     Temp 98.1 F (36.7 C)     Temp Source Oral     SpO2 98 %     Weight 140 lb (63.5 kg)     Height 6' (1.829 m)     Head Circumference      Peak Flow      Pain Score 10     Pain Loc      Pain Education      Exclude from Growth Chart     Most recent vital signs: Vitals:   12/06/23 0707  BP: (!) 142/106  Pulse: 97  Resp: 18  Temp: 98.1 F (36.7 C)  SpO2: 98%     General: Awake, no distress.   CV:  Good peripheral perfusion. regular rate and  rhythm Resp:  Normal effort.  Abd:  No distention.   Other:  Right sided jaw swelling, severe dental decay noted, large amount of swelling of the right lower gumline, abscess noted, neck is supple, no lymphadenopathy   ED Results / Procedures / Treatments   Labs (all labs ordered are listed, but only abnormal results are displayed) Labs Reviewed - No data to display   EKG     RADIOLOGY     PROCEDURES:   Dental Block  Date/Time: 12/06/2023 9:18 AM  Performed by: Faythe Ghee, PA-C Authorized by: Faythe Ghee, PA-C   Consent:    Consent obtained:  Verbal   Consent given by:  Patient   Risks, benefits, and alternatives  were discussed: yes     Risks discussed:  Allergic reaction, infection, nerve damage, swelling, unsuccessful block, pain, intravascular injection and hematoma   Alternatives discussed:  No treatment Universal protocol:    Procedure explained and questions answered to patient or proxy's satisfaction: yes     Immediately prior to procedure, a time out was called: yes     Patient identity confirmed:  Verbally with patient Indications:    Indications: dental abscess   Location:    Block type:  Inferior alveolar   Laterality:  Right Procedure details:    Topical anesthetic:  Tetracaine gel   Syringe type:  Aspirating dental syringe   Needle gauge:  30 G   Anesthetic injected:  Lidocaine 2% WITH epi   Injection procedure:  Anatomic landmarks identified, introduced needle, incremental injection, anatomic landmarks palpated and negative aspiration for blood Post-procedure details:    Outcome:  Anesthesia achieved   Procedure completion:  Tolerated well,  no immediate complications .Incision and Drainage  Date/Time: 12/06/2023 9:20 AM  Performed by: Faythe Ghee, PA-C Authorized by: Faythe Ghee, PA-C   Consent:    Consent obtained:  Verbal   Consent given by:  Patient   Risks, benefits, and alternatives were discussed: yes     Risks discussed:  Bleeding, incomplete drainage, pain, damage to other organs and infection   Alternatives discussed:  No treatment Universal protocol:    Procedure explained and questions answered to patient or proxy's satisfaction: yes     Immediately prior to procedure, a time out was called: yes     Patient identity confirmed:  Verbally with patient Location:    Type:  Abscess   Location:  Mouth   Mouth location:  Alveolar process Pre-procedure details:    Skin preparation:  Povidone-iodine Sedation:    Sedation type:  None Anesthesia:    Anesthesia method:  Topical application and nerve block   Topical anesthetic:  LET   Block needle gauge:  30  G   Block anesthetic:  Lidocaine 2% WITH epi   Block technique:  Buccal   Block injection procedure:  Anatomic landmarks identified, introduced needle, incremental injection, anatomic landmarks palpated and negative aspiration for blood   Block outcome:  Anesthesia achieved Procedure type:    Complexity:  Simple Procedure details:    Ultrasound guidance: no     Needle aspiration: no     Incision types:  Stab incision   Incision depth:  Dermal   Drainage:  Purulent   Drainage amount:  Copious   Wound treatment:  Wound left open Post-procedure details:    Procedure completion:  Tolerated well, no immediate complications  Chief Complaint  Patient presents with   Dental Pain      MEDICATIONS ORDERED IN ED: Medications  lidocaine-EPINEPHrine-tetracaine (LET) topical gel (3 mLs Topical Given by Other 12/06/23 0801)  lidocaine-EPINEPHrine (XYLOCAINE W/EPI) 2 %-1:100000 (with pres) injection 1.7 mL (1.7 mLs Intradermal Given by Other 12/06/23 0800)  fentaNYL (SUBLIMAZE) injection 50 mcg (50 mcg Intramuscular Given 12/06/23 0754)     IMPRESSION / MDM / ASSESSMENT AND PLAN / ED COURSE  I reviewed the triage vital signs and the nursing notes.                              Differential diagnosis includes, but is not limited to, dental abscess, osteomyelitis of the mandible, cellulitis  Patient's presentation is most consistent with acute illness / injury with system symptoms.   Dental block performed, see procedure note  Incision and drainage see procedure note  Patient was given fentanyl 50 mcg IM for pain,  Patient tolerated procedures and administration of pain medication well.  He was given a prescription for clindamycin, Percocet 5/325, #8 no refill.  He is to follow-up with the oral surgeon that he has become established with.  Patient is trying to get more money to be able to have all the teeth pulled.  He is to return emergency department if worsening.  He is in agreement  treatment plan.  He is given a work note and discharged stable condition.      FINAL CLINICAL IMPRESSION(S) / ED DIAGNOSES   Final diagnoses:  Dental abscess     Rx / DC Orders   ED Discharge Orders          Ordered    clindamycin (CLEOCIN) 150 MG capsule  3 times daily  12/06/23 0810    oxyCODONE-acetaminophen (PERCOCET) 5-325 MG tablet  Every 4 hours PRN        12/06/23 0810             Note:  This document was prepared using Dragon voice recognition software and may include unintentional dictation errors.    Faythe Ghee, PA-C 12/06/23 9604    Minna Antis, MD 12/06/23 1332

## 2023-12-06 NOTE — ED Triage Notes (Signed)
Pt arrived via POV with reports of right side mouth pain and swelling, pt states he needs to see an oral surgeon, but is unable to afford it at this time.  Swelling noted, pt unable to eat and drink on the R side c/o pain.

## 2023-12-06 NOTE — Discharge Instructions (Signed)
The dental block will start to wear off in about an hour to 2 hours.  Go ahead and take ibuprofen and apply ice to your jaw.  Later today pick up your antibiotic and pain medication.  Take the antibiotic as prescribed.  Take pain medication as needed.  Return emergency department if worsening
